# Patient Record
Sex: Female | Born: 2000 | Race: Black or African American | Hispanic: No | Marital: Single | Smoking: Never smoker
Health system: Southern US, Community
[De-identification: ages and names within clinical notes are randomized; demographics above are authoritative.]

## PROBLEM LIST (undated history)

## (undated) ENCOUNTER — Inpatient Hospital Stay (HOSPITAL_COMMUNITY): Payer: Self-pay

## (undated) DIAGNOSIS — Z789 Other specified health status: Secondary | ICD-10-CM

## (undated) DIAGNOSIS — A749 Chlamydial infection, unspecified: Secondary | ICD-10-CM

## (undated) HISTORY — DX: Chlamydial infection, unspecified: A74.9

## (undated) HISTORY — PX: NO PAST SURGERIES: SHX2092

---
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
## 2021-05-30 ENCOUNTER — Ambulatory Visit: Payer: Medicaid Other | Admitting: Obstetrics and Gynecology

## 2021-06-09 ENCOUNTER — Encounter (HOSPITAL_COMMUNITY): Payer: Self-pay | Admitting: Emergency Medicine

## 2021-06-09 ENCOUNTER — Other Ambulatory Visit: Payer: Self-pay

## 2021-06-09 ENCOUNTER — Emergency Department (HOSPITAL_COMMUNITY)
Admission: EM | Admit: 2021-06-09 | Discharge: 2021-06-09 | Disposition: A | Discharge status: Home / Self Care | Payer: Medicaid Other | Attending: Emergency Medicine | Admitting: Emergency Medicine

## 2021-06-09 DIAGNOSIS — Z5321 Procedure and treatment not carried out due to patient leaving prior to being seen by health care provider: Secondary | ICD-10-CM | POA: Diagnosis not present

## 2021-06-09 DIAGNOSIS — R300 Dysuria: Secondary | ICD-10-CM | POA: Diagnosis not present

## 2021-06-09 DIAGNOSIS — R102 Pelvic and perineal pain: Secondary | ICD-10-CM | POA: Diagnosis present

## 2021-06-09 LAB — CBC WITH DIFFERENTIAL/PLATELET
Abs Immature Granulocytes: 0.02 10*3/uL (ref 0.00–0.07)
Basophils Absolute: 0 10*3/uL (ref 0.0–0.1)
Basophils Relative: 0 %
Eosinophils Absolute: 0.1 10*3/uL (ref 0.0–0.5)
Eosinophils Relative: 1 %
HCT: 37 % (ref 36.0–46.0)
Hemoglobin: 11.9 g/dL — ABNORMAL LOW (ref 12.0–15.0)
Immature Granulocytes: 0 %
Lymphocytes Relative: 37 %
Lymphs Abs: 2.7 10*3/uL (ref 0.7–4.0)
MCH: 29.9 pg (ref 26.0–34.0)
MCHC: 32.2 g/dL (ref 30.0–36.0)
MCV: 93 fL (ref 80.0–100.0)
Monocytes Absolute: 0.5 10*3/uL (ref 0.1–1.0)
Monocytes Relative: 7 %
Neutro Abs: 3.9 10*3/uL (ref 1.7–7.7)
Neutrophils Relative %: 55 %
Platelets: 295 10*3/uL (ref 150–400)
RBC: 3.98 MIL/uL (ref 3.87–5.11)
RDW: 13.8 % (ref 11.5–15.5)
WBC: 7.2 10*3/uL (ref 4.0–10.5)
nRBC: 0 % (ref 0.0–0.2)

## 2021-06-09 LAB — COMPREHENSIVE METABOLIC PANEL
ALT: 11 U/L (ref 0–44)
AST: 18 U/L (ref 15–41)
Albumin: 4.3 g/dL (ref 3.5–5.0)
Alkaline Phosphatase: 61 U/L (ref 38–126)
Anion gap: 8 (ref 5–15)
BUN: 17 mg/dL (ref 6–20)
CO2: 23 mmol/L (ref 22–32)
Calcium: 9 mg/dL (ref 8.9–10.3)
Chloride: 107 mmol/L (ref 98–111)
Creatinine, Ser: 0.82 mg/dL (ref 0.44–1.00)
GFR, Estimated: >60 mL/min (ref >60)
Glucose, Bld: 86 mg/dL (ref 70–99)
Potassium: 3.9 mmol/L (ref 3.5–5.1)
Sodium: 138 mmol/L (ref 135–145)
Total Bilirubin: 0.4 mg/dL (ref 0.3–1.2)
Total Protein: 7.7 g/dL (ref 6.5–8.1)

## 2021-06-09 LAB — URINALYSIS, ROUTINE W REFLEX MICROSCOPIC
Bilirubin Urine: NEGATIVE
Glucose, UA: NEGATIVE mg/dL
Hgb urine dipstick: NEGATIVE
Ketones, ur: NEGATIVE mg/dL
Nitrite: NEGATIVE
Protein, ur: NEGATIVE mg/dL
Specific Gravity, Urine: 1.027 (ref 1.005–1.030)
pH: 5 (ref 5.0–8.0)

## 2021-06-09 LAB — I-STAT BETA HCG BLOOD, ED (MC, WL, AP ONLY): I-stat hCG, quantitative: <5 m[IU]/mL (ref <5)

## 2021-06-09 LAB — PREGNANCY, URINE: Preg Test, Ur: NEGATIVE

## 2021-06-09 NOTE — ED Triage Notes (Signed)
Patient states she has been having sharp pelvic pain after her period ended 2 weeks ago. Patient states no vaginal bleeding or discharge, patient unsure if she is pregnant.

## 2021-06-09 NOTE — ED Provider Notes (Signed)
Emergency Medicine Provider Triage Evaluation Note  Ana Horton , a 20 y.o. female  was evaluated in triage.  Pt complains of pelvic pain x 2 weeks.  Reports some dysuria.  Denies new or unusual vaginal discharge or bleeding.  Denies any treatment PTA.  Review of Systems  Positive: Suprapubic pain, abdominal pain Negative: Fever, chills  Physical Exam  BP 117/86 (BP Location: Right Arm)   Pulse 87   Temp 98.5 F (36.9 C)   Resp 18   LMP 05/25/2021 (Approximate)   SpO2 100%  Gen:   Awake, no distress   Resp:  Normal effort  MSK:   Moves extremities without difficulty  Other:    Medical Decision Making  Medically screening exam initiated at 1:25 AM.  Appropriate orders placed.  Ana Horton was informed that the remainder of the evaluation will be completed by another provider, this initial triage assessment does not replace that evaluation, and the importance of remaining in the ED until their evaluation is complete.  Pelvic pain   Roxy Horseman, PA-C 06/09/21 0126    Jacalyn Lefevre, MD 06/09/21 [phone]

## 2021-06-27 ENCOUNTER — Ambulatory Visit: Payer: Medicaid Other | Admitting: Obstetrics & Gynecology

## 2021-07-03 ENCOUNTER — Encounter: Payer: Medicaid Other | Admitting: Obstetrics and Gynecology

## 2021-08-04 ENCOUNTER — Encounter (HOSPITAL_COMMUNITY): Payer: Self-pay

## 2021-08-04 ENCOUNTER — Emergency Department (HOSPITAL_COMMUNITY)
Admission: EM | Admit: 2021-08-04 | Discharge: 2021-08-04 | Disposition: A | Discharge status: Home / Self Care | Payer: Medicaid Other | Attending: Emergency Medicine | Admitting: Emergency Medicine

## 2021-08-04 ENCOUNTER — Other Ambulatory Visit: Payer: Self-pay

## 2021-08-04 DIAGNOSIS — O219 Vomiting of pregnancy, unspecified: Secondary | ICD-10-CM | POA: Insufficient documentation

## 2021-08-04 DIAGNOSIS — Z20822 Contact with and (suspected) exposure to covid-19: Secondary | ICD-10-CM | POA: Insufficient documentation

## 2021-08-04 DIAGNOSIS — Z3A01 Less than 8 weeks gestation of pregnancy: Secondary | ICD-10-CM | POA: Diagnosis not present

## 2021-08-04 DIAGNOSIS — O26851 Spotting complicating pregnancy, first trimester: Secondary | ICD-10-CM | POA: Insufficient documentation

## 2021-08-04 DIAGNOSIS — O209 Hemorrhage in early pregnancy, unspecified: Secondary | ICD-10-CM | POA: Diagnosis not present

## 2021-08-04 DIAGNOSIS — O00102 Left tubal pregnancy without intrauterine pregnancy: Secondary | ICD-10-CM | POA: Diagnosis not present

## 2021-08-04 DIAGNOSIS — Z3490 Encounter for supervision of normal pregnancy, unspecified, unspecified trimester: Secondary | ICD-10-CM

## 2021-08-04 LAB — PREGNANCY, URINE: Preg Test, Ur: POSITIVE — AB

## 2021-08-04 LAB — RESP PANEL BY RT-PCR (FLU A&B, COVID) ARPGX2
Influenza A by PCR: NEGATIVE
Influenza B by PCR: NEGATIVE
SARS Coronavirus 2 by RT PCR: NEGATIVE

## 2021-08-04 NOTE — Discharge Instructions (Addendum)
It was a pleasure taking care of you today!  Your COVID and flu swab was negative.  Your urine pregnancy test was positive in the ED.  Attached you will find information for the [facility] at [facility] med Center for women, call on 08/05/2021 and set up a follow-up appointment regarding today's ED visit.  Ensure to maintain fluid intake.  Start taking over-the-counter prenatal vitamins daily.  You may follow-up with your primary care provider as needed.  Return to the ED if you are experiencing increasing/worsening abdominal cramping, vaginal fluid loss, worsening symptoms.

## 2021-08-04 NOTE — ED Provider Notes (Signed)
[facility] DEPT Provider Note   CSN: [id number] Arrival date & time: 08/04/21  1939     History  Chief Complaint  Patient presents with   Nausea   Abdominal Cramping    Ana Horton is a 21 y.o. female who presents to the ED complaining of nausea and abdominal cramping onset today.  Patient has associated nonbloody emesis (1 episode), increased fatigue.  Has not tried any medication for symptoms.  She notes that this is the second week of her menstrual cycle and she is currently spotting.  She is sexually active with 1 female partner with intermittent protected intercourse.  Denies concerns for STD at this time.  Denies fever, chills, nausea, constipation, diarrhea, dysuria, hematuria, vaginal discharge.  The history is provided by the patient. No language interpreter was used.      Home Medications Prior to Admission medications   Medication Sig Start Date End Date Taking? Authorizing Provider  BIOTIN PO Take by mouth.    [provider]  fluconazole (DIFLUCAN) 150 MG tablet Take 1 tablet (150 mg total) by mouth daily. 11/25/19   Clarnce Flock, MD  sertraline (ZOLOFT) 50 MG tablet Take 1 tablet (50 mg total) by mouth daily. 11/23/19   Clarnce Flock, MD  Turmeric (QC TUMERIC COMPLEX PO) Take by mouth.    [provider]      Allergies    Patient has no known allergies.    Review of Systems   Review of Systems  Constitutional:  Positive for fatigue. Negative for chills and fever.  Gastrointestinal:  Positive for vomiting. Negative for abdominal pain, constipation, diarrhea and nausea.  Genitourinary:  Negative for dysuria, hematuria and vaginal discharge.  Skin:  Negative for rash.  All other systems reviewed and are negative.  Physical Exam Updated Vital Signs BP 131/80 (BP Location: Left Arm)    Pulse 89    Temp 99.3 F (37.4 C) (Oral)    Resp 18    Ht 5\' 4"  (1.626 m)    Wt 72.6 kg    SpO2 95%    BMI 27.46 kg/m   Physical Exam Vitals and nursing note reviewed.  Constitutional:      General: She is not in acute distress.    Appearance: She is not diaphoretic.  HENT:     Head: Normocephalic and atraumatic.     Mouth/Throat:     Pharynx: No oropharyngeal exudate.  Eyes:     General: No scleral icterus.    Conjunctiva/sclera: Conjunctivae normal.  Cardiovascular:     Rate and Rhythm: Normal rate and regular rhythm.     Pulses: Normal pulses.     Heart sounds: Normal heart sounds.  Pulmonary:     Effort: Pulmonary effort is normal. No respiratory distress.     Breath sounds: Normal breath sounds. No wheezing.  Abdominal:     General: Bowel sounds are normal.     Palpations: Abdomen is soft. There is no mass.     Tenderness: There is no abdominal tenderness. There is no guarding or rebound.     Comments: No tenderness to palpation noted to abdomen.  Musculoskeletal:        General: Normal range of motion.     Cervical back: Normal range of motion and neck supple.  Skin:    General: Skin is warm and dry.  Neurological:     Mental Status: She is alert.  Psychiatric:        Behavior: Behavior  normal.    ED Results / Procedures / Treatments   Labs (all labs ordered are listed, but only abnormal results are displayed) Labs Reviewed  PREGNANCY, URINE - Abnormal; Notable for the following components:      Result Value   Preg Test, Ur POSITIVE (*)    All other components within normal limits  RESP PANEL BY RT-PCR (FLU A&B, COVID) [id number]    EKG None  Radiology No results found.  Procedures Procedures    Medications Ordered in ED Medications - No data to display  ED Course/ Medical Decision Making/ A&P Clinical Course as of 08/04/21 2230  Nancy Fetter Aug 04, 2021  2145 Case discussed with attending who agrees with giving patient information for women's clinic for follow-up and for patient to take prenatal vitamins. [SB]  2155 Detailed discussion with patient and her fianc at bedside  regarding positive pregnancy test and discharge treatment plan.  Answered all questions as they arose.  Patient agreeable to discharge treatment plan.  Patient appears safe for discharge at this time. [SB]    Clinical Course User Index [SB] Vlada Uriostegui A, PA-C                           Medical Decision Making Amount and/or Complexity of Data Reviewed Labs: ordered.   Patient presents to the ED with nausea and abdominal cramping onset today.  She has had 1 episode of nonbloody emesis.  Also she is on week 2 of her menstrual cycle and notes that she is now having spotting.  Denies concerns for STI at this time.  Vital signs stable, patient afebrile, not tachycardic or hypoxic.  On exam patient without acute cardiovascular, respiratory, abdominal exam findings.  Differential diagnosis includes COVID, flu, viral gastroenteritis, pregnancy.  Labs:  I ordered, and personally interpreted labs.  The pertinent results include:  COVID and flu swab negative. Urine pregnancy test positive  Disposition: Patient presentation suspicious for pregnancy as cause of her symptoms.  Doubt COVID, flu, viral gastroenteritis at this time.  After consideration of the diagnostic results and the patients response to treatment, I feel that the patient would benefit from discharge home with [facility]  Detailed discussion held at bedside with patient and her fianc regarding importance of following up with [facility].  Also discussed importance of starting prenatal vitamins at bedside.  Patient notes that she has already been taking prenatal vitamins.  Supportive care measures and strict return precautions discussed with patient at bedside. Pt acknowledges and verbalizes understanding. Pt appears safe for discharge. Follow up as indicated in discharge paperwork.    This chart was dictated using voice recognition software, Dragon.  Despite the best efforts of this provider to proofread and correct errors, errors may still occur which can change documentation meaning.  Final Clinical Impression(s) / ED Diagnoses Final diagnoses:  Pregnancy, unspecified gestational age    Rx / DC Orders ED Discharge Orders     None         Ranelle Auker A, PA-C 08/04/21 2230    Lacretia Leigh, MD 08/08/21 2248

## 2021-08-04 NOTE — ED Triage Notes (Signed)
Pt reports with nausea and abdominal cramping since today. Pt had one episode of emesis. Pt states that she has been feeling more tired lately. Pt is currently "spotting" going on her second week.

## 2021-08-05 ENCOUNTER — Inpatient Hospital Stay (HOSPITAL_COMMUNITY): Payer: Medicaid Other

## 2021-08-05 ENCOUNTER — Encounter (HOSPITAL_COMMUNITY): Payer: Self-pay | Admitting: Obstetrics & Gynecology

## 2021-08-05 ENCOUNTER — Telehealth: Payer: Self-pay | Admitting: Family Medicine

## 2021-08-05 ENCOUNTER — Inpatient Hospital Stay (HOSPITAL_COMMUNITY)
Admission: AD | Admit: 2021-08-05 | Discharge: 2021-08-06 | Disposition: A | Discharge status: Home / Self Care | Payer: Medicaid Other | Attending: Obstetrics & Gynecology | Admitting: Obstetrics & Gynecology

## 2021-08-05 DIAGNOSIS — O029 Abnormal product of conception, unspecified: Secondary | ICD-10-CM | POA: Insufficient documentation

## 2021-08-05 DIAGNOSIS — O209 Hemorrhage in early pregnancy, unspecified: Secondary | ICD-10-CM

## 2021-08-05 DIAGNOSIS — O00102 Left tubal pregnancy without intrauterine pregnancy: Secondary | ICD-10-CM | POA: Diagnosis present

## 2021-08-05 DIAGNOSIS — O081 Delayed or excessive hemorrhage following ectopic and molar pregnancy: Secondary | ICD-10-CM | POA: Insufficient documentation

## 2021-08-05 LAB — CBC
HCT: 37.2 % (ref 36.0–46.0)
Hemoglobin: 12 g/dL (ref 12.0–15.0)
MCH: 29.7 pg (ref 26.0–34.0)
MCHC: 32.3 g/dL (ref 30.0–36.0)
MCV: 92.1 fL (ref 80.0–100.0)
Platelets: 283 10*3/uL (ref 150–400)
RBC: 4.04 MIL/uL (ref 3.87–5.11)
RDW: 13.6 % (ref 11.5–15.5)
WBC: 9.5 10*3/uL (ref 4.0–10.5)
nRBC: 0 % (ref 0.0–0.2)

## 2021-08-05 LAB — HCG, QUANTITATIVE, PREGNANCY: hCG, Beta Chain, Quant, S: 5962 m[IU]/mL — ABNORMAL HIGH (ref <5)

## 2021-08-05 LAB — COMPREHENSIVE METABOLIC PANEL
ALT: 11 U/L (ref 0–44)
AST: 16 U/L (ref 15–41)
Albumin: 4.1 g/dL (ref 3.5–5.0)
Alkaline Phosphatase: 65 U/L (ref 38–126)
Anion gap: 9 (ref 5–15)
BUN: 8 mg/dL (ref 6–20)
CO2: 24 mmol/L (ref 22–32)
Calcium: 9.4 mg/dL (ref 8.9–10.3)
Chloride: 103 mmol/L (ref 98–111)
Creatinine, Ser: 0.78 mg/dL (ref 0.44–1.00)
GFR, Estimated: >60 mL/min (ref >60)
Glucose, Bld: 84 mg/dL (ref 70–99)
Potassium: 3.6 mmol/L (ref 3.5–5.1)
Sodium: 136 mmol/L (ref 135–145)
Total Bilirubin: 0.3 mg/dL (ref 0.3–1.2)
Total Protein: 7.1 g/dL (ref 6.5–8.1)

## 2021-08-05 LAB — ABO/RH: ABO/RH(D): A POS

## 2021-08-05 MED ORDER — METHOTREXATE FOR ECTOPIC PREGNANCY
50.0000 mg/m2 | Freq: Once | INTRAMUSCULAR | Status: AC
  Administered 2021-08-05: 92.5 mg via INTRAMUSCULAR
  Filled 2021-08-05: qty 10

## 2021-08-05 NOTE — MAU Note (Signed)
Pt presents to MAU with c/o bleeding and mild cramping for the past two weeks. Pt recently found out she was pregnant on yesterday. Bleeding is light today, she reports that uses about 2 pads per day.  Pain = 1/10.

## 2021-08-05 NOTE — MAU Note (Signed)
PT SAYS WENT TO [facility]  LAST NIGHT -TO CONFIRM PREG- POSITIVE  STARTED  SPOTTING LAST WEEK ? WHEN SHE WIPED - RED, BROWN , PINK  TODAY RED WHEN SHE WIPED FEELS LESS THAN MILD CRAMPS - 2 VOMITED YESTERDAY AT WORK .

## 2021-08-05 NOTE — Progress Notes (Signed)
I have viewed all of the sonogram images and agree that this is a left ectopic pregnancy 1.37 cm with a yolk sac, HCG pending at time of this note.  A positive  However I disagree with the conclusion that this is a ruptured ectopic, clinically that is not the case and sonographically while there is complex fluid in the tube there is little blood flow around the ectopic itself and the fluid is in the tube measuring 1.67 in diameter and is contained not free in the pelvis.  This was also the real time conclusion of the examining technologist.  I evaluated all of the images and there is no evidence of free fluid and rupture and again clinically this is not the case  I have consulted with patient at length and she agrees with this approach including the methotrexate therapy and surveillance  She understands there is about 15% chance of rupture, but with the size much less likely than the study average.  Lazaro Arms, MD 08/05/2021 10:30 PM

## 2021-08-05 NOTE — MAU Provider Note (Signed)
Event Date/Time   First Provider Initiated Contact with Patient 08/05/21 2150      Chief Complaint:  Vaginal Bleeding   Ana Horton is  21 y.o. G1P0000.  Patient's last menstrual period was 07/25/2021.Marland Kitchen.  Her pregnancy status is positive.  She presents complaining of Vaginal Bleeding . She has been having small amounts of spotting for a few weeks,  "Not really" any abdominal pain.    No past medical history on file.  No past surgical history on file.  Family History  Problem Relation Age of Onset   Hypertension Father    Thyroid disease Mother     Social History   Tobacco Use   Smoking status: Some Days   Smokeless tobacco: Never   Tobacco comments:    black and mild  Vaping Use   Vaping Use: Every day  Substance Use Topics   Alcohol use: No   Drug use: No    Allergies: No Known Allergies  Medications Prior to Admission  Medication Sig Dispense Refill Last Dose   BIOTIN PO Take by mouth.      fluconazole (DIFLUCAN) 150 MG tablet Take 1 tablet (150 mg total) by mouth daily. 1 tablet 0    sertraline (ZOLOFT) 50 MG tablet Take 1 tablet (50 mg total) by mouth daily. 30 tablet 5    Turmeric (QC TUMERIC COMPLEX PO) Take by mouth.        Review of Systems   Constitutional: Negative for fever and chills Eyes: Negative for visual disturbances Respiratory: Negative for shortness of breath, dyspnea Cardiovascular: Negative for chest pain or palpitations  Gastrointestinal: Negative for vomiting, diarrhea and constipation Genitourinary: Negative for dysuria and urgency Musculoskeletal: Negative for back pain, joint pain, myalgias  Neurological: Negative for dizziness and headaches     Physical Exam   Blood pressure 116/69, pulse 66, temperature 99.2 F (37.3 C), temperature source Oral, resp. rate 20, height 5\' 3"  (1.6 m), weight 77.5 kg, last menstrual period 07/25/2021, SpO2 100 %.  General: General appearance - alert, well appearing, and in no distress and  oriented to person, place, and time Chest - normal respiratory effort Heart - normal rate and regular rhythm Abdomen - soft, non tender Pelvic - deferred Extremities - no pedal edema noted   Labs: Results for orders placed or performed during the hospital encounter of 08/05/21 (from the past 24 hour(s))  CBC   Collection Time: 08/05/21  8:52 PM  Result Value Ref Range   WBC 9.5 4.0 - 10.5 K/uL   RBC 4.04 3.87 - 5.11 MIL/uL   Hemoglobin 12.0 12.0 - 15.0 g/dL   HCT 40.937.2 81.136.0 - 91.446.0 %   MCV 92.1 80.0 - 100.0 fL   MCH 29.7 26.0 - 34.0 pg   MCHC 32.3 30.0 - 36.0 g/dL   RDW 78.213.6 95.611.5 - 21.315.5 %   Platelets 283 150 - 400 K/uL   nRBC 0.0 0.0 - 0.2 %  hCG, quantitative, pregnancy   Collection Time: 08/05/21  8:52 PM  Result Value Ref Range   hCG, Beta Chain, Quant, S 5,962 (H) <5 mIU/mL  Comprehensive metabolic panel   Collection Time: 08/05/21  8:52 PM  Result Value Ref Range   Sodium 136 135 - 145 mmol/L   Potassium 3.6 3.5 - 5.1 mmol/L   Chloride 103 98 - 111 mmol/L   CO2 24 22 - 32 mmol/L   Glucose, Bld 84 70 - 99 mg/dL   BUN 8 6 - 20 mg/dL  Creatinine, Ser 0.78 0.44 - 1.00 mg/dL   Calcium 9.4 8.9 - 33.8 mg/dL   Total Protein 7.1 6.5 - 8.1 g/dL   Albumin 4.1 3.5 - 5.0 g/dL   AST 16 15 - 41 U/L   ALT 11 0 - 44 U/L   Alkaline Phosphatase 65 38 - 126 U/L   Total Bilirubin 0.3 0.3 - 1.2 mg/dL   GFR, Estimated >25 >05 mL/min   Anion gap 9 5 - 15  ABO/Rh   Collection Time: 08/05/21  8:52 PM  Result Value Ref Range   ABO/RH(D) A POS    No rh immune globuloin      NOT A RH IMMUNE GLOBULIN CANDIDATE, PT RH POSITIVE Performed at [facility], [address] [place], [place] [address]    Imaging Studies:  US OB LESS THAN 14 WEEKS WITH OB TRANSVAGINAL  Result Date: 08/05/2021 CLINICAL DATA:  Vaginal bleeding, pelvic cramping, LMP 07/25/2021 EXAM: OBSTETRIC <14 WK [place] AND TRANSVAGINAL OB US TECHNIQUE: Both transabdominal and transvaginal ultrasound examinations were  performed for complete evaluation of the gestation as well as the maternal uterus, adnexal regions, and pelvic cul-de-sac. Transvaginal technique was performed to assess early pregnancy. COMPARISON:  None. FINDINGS: Intrauterine gestational sac: None identified Maternal uterus/adnexae: The uterus is anteverted. No intrarenal masses are seen. The cervix is closed and is unremarkable. The endometrial stripe is uniform measuring 8 mm in thickness. The right ovary is unremarkable. Immediately adjacent to the left ovary is a cystic structure demonstrating echogenic margins and a a internal yolk sac compatible with an ectopic gestational sac either within or immediately adjacent to the left ovary. This is best appreciated on cine images. There is a moderate amount of complex appearing fluid within the cul-de-sac demonstrating echogenic internal debris best appreciated on image # 19 likely representing blood product in the setting of a ruptured ectopic pregnancy. IMPRESSION: Extrauterine gestational sac immediately adjacent or within the left ovary with moderate complex appearing fluid within the pelvis likely representing blood product in keeping with a ruptured ectopic pregnancy. Electronically Signed   By: Helyn Numbers M.D.   On: 08/05/2021 21:39    Per Dr. Despina Hidden:   I have viewed all of the sonogram images and agree that this is a left ectopic pregnancy 1.37 cm with a yolk sac, HCG pending at time of this note.  A positive   However I disagree with the conclusion that this is a ruptured ectopic, clinically that is not the case and sonographically while there is complex fluid in the tube there is little blood flow around the ectopic itself and the fluid is in the tube measuring 1.67 in diameter and is contained not free in the pelvis.  This was also the real time conclusion of the examining technologist.  I evaluated all of the images and there is no evidence of free fluid and rupture and again clinically this is  not the case   I have consulted with patient at length and she agrees with this approach including the methotrexate therapy and surveillance  She understands there is about 15% chance of rupture, but with the size much less likely than the study average.   Lazaro Arms, MD 08/05/2021 10:30 PM       Assessment: Left ectopic pregnancy  Plan: Methotrexate Treatment Protocol for Ectopic Pregnancy  Pretreatment testing and instructions  hCG concentration  Transvaginal ultrasound  Blood group and Rh(D) typing; Complete blood count  Liver and renal function tests  Discontinue folic acid supplements  Counsel patient to avoid NSAIDs, recommend acetaminophen if an analgesic is needed  Advise patient to refrain from sexual intercourse and strenuous exercise  Treatment day  Single dose protocol   1  hCG.  Administer Methotrexate 50 mg/m2 body surface area IM  4 (Saturday per [facility]) hCG  7  hCG  If <15 percent hCG decline from day 4 to 7, give additional dose of methotrexate 50 mg/m2 IM  If ?15 percent hCG decline from day 4 to 7, draw hCG weekly until undetectable  14  hCG  If <15 percent hCG decline from day 7 to 14, give additional dose of methotrexate 50 mg/m2 IM  If ?15 percent hCG decline from day 7 to 14, check hCG weekly until undetectable  21 and 28  If 3 doses have been given and there is a <15 percent hCG decline from day 21 to 28, proceed with laparoscopic surgery    The risks of methotrexate were reviewed including failure requiring repeat dosing or eventual surgery. She understands that methotrexate involves frequent return visits to monitor lab values and that she remains at risk of ectopic rupture until her beta is less than assay. The patient opts to proceed with methotrexate. She has no history of hepatic or renal dysfunction, has  normal BUN/Cr/LFT's/platelets. She is felt to be reliable for follow-up. Side effects of photosensitivity & GI upset were discussed. She knows to  avoid direct sunlight and abstain from alcohol, aspirin and aspirin-like products for two weeks. She was counseled to discontinue any MVI with folic acid. She understands to follow up on D4 (2/11) and D7 (2/14) for repeat BHCG and was given the instruction sheet. Strict ectopic precautions were reviewed, the patient knows to call with any abdominal pain, vomiting, fainting, or any concerns with her health. Rh +.  Christin Fudge

## 2021-08-05 NOTE — Telephone Encounter (Signed)
Patient called in to be scheduled for a new OB appointment..patient stated she has been spotting for almost 3 weeks now, spoke to two of our clinical staff and they advised patient to go to the [facility].

## 2021-08-10 ENCOUNTER — Other Ambulatory Visit: Payer: Self-pay

## 2021-08-10 ENCOUNTER — Inpatient Hospital Stay (HOSPITAL_COMMUNITY): Payer: Medicaid Other

## 2021-08-10 ENCOUNTER — Inpatient Hospital Stay (HOSPITAL_BASED_OUTPATIENT_CLINIC_OR_DEPARTMENT_OTHER): Payer: Medicaid Other | Admitting: Registered Nurse

## 2021-08-10 ENCOUNTER — Ambulatory Visit (HOSPITAL_COMMUNITY)
Admission: AD | Admit: 2021-08-10 | Discharge: 2021-08-10 | Disposition: A | Discharge status: Home / Self Care | Payer: Medicaid Other | Attending: Obstetrics and Gynecology | Admitting: Obstetrics and Gynecology

## 2021-08-10 ENCOUNTER — Inpatient Hospital Stay (HOSPITAL_COMMUNITY): Payer: Medicaid Other | Admitting: Registered Nurse

## 2021-08-10 ENCOUNTER — Encounter (HOSPITAL_COMMUNITY): Payer: Self-pay | Admitting: Obstetrics and Gynecology

## 2021-08-10 ENCOUNTER — Encounter (HOSPITAL_COMMUNITY)
Admission: AD | Disposition: A | Discharge status: Home / Self Care | Payer: Self-pay | Source: Home / Self Care | Attending: Obstetrics and Gynecology

## 2021-08-10 ENCOUNTER — Telehealth (HOSPITAL_COMMUNITY): Payer: Self-pay | Admitting: *Deleted

## 2021-08-10 ENCOUNTER — Other Ambulatory Visit (HOSPITAL_COMMUNITY): Payer: Medicaid Other

## 2021-08-10 DIAGNOSIS — F418 Other specified anxiety disorders: Secondary | ICD-10-CM | POA: Diagnosis not present

## 2021-08-10 DIAGNOSIS — O00102 Left tubal pregnancy without intrauterine pregnancy: Secondary | ICD-10-CM | POA: Insufficient documentation

## 2021-08-10 DIAGNOSIS — O00109 Unspecified tubal pregnancy without intrauterine pregnancy: Secondary | ICD-10-CM

## 2021-08-10 DIAGNOSIS — F17210 Nicotine dependence, cigarettes, uncomplicated: Secondary | ICD-10-CM | POA: Insufficient documentation

## 2021-08-10 DIAGNOSIS — O00202 Left ovarian pregnancy without intrauterine pregnancy: Secondary | ICD-10-CM | POA: Diagnosis not present

## 2021-08-10 HISTORY — PX: LAPAROSCOPIC UNILATERAL SALPINGECTOMY: SHX5934

## 2021-08-10 HISTORY — DX: Unspecified tubal pregnancy without intrauterine pregnancy: O00.109

## 2021-08-10 HISTORY — DX: Other specified health status: Z78.9

## 2021-08-10 LAB — CBC WITH DIFFERENTIAL/PLATELET
Abs Immature Granulocytes: 0.03 10*3/uL (ref 0.00–0.07)
Basophils Absolute: 0 10*3/uL (ref 0.0–0.1)
Basophils Relative: 0 %
Eosinophils Absolute: 0 10*3/uL (ref 0.0–0.5)
Eosinophils Relative: 0 %
HCT: 36.8 % (ref 36.0–46.0)
Hemoglobin: 12.4 g/dL (ref 12.0–15.0)
Immature Granulocytes: 0 %
Lymphocytes Relative: 21 %
Lymphs Abs: 1.9 10*3/uL (ref 0.7–4.0)
MCH: 30.7 pg (ref 26.0–34.0)
MCHC: 33.7 g/dL (ref 30.0–36.0)
MCV: 91.1 fL (ref 80.0–100.0)
Monocytes Absolute: 0.4 10*3/uL (ref 0.1–1.0)
Monocytes Relative: 4 %
Neutro Abs: 7 10*3/uL (ref 1.7–7.7)
Neutrophils Relative %: 75 %
Platelets: 263 10*3/uL (ref 150–400)
RBC: 4.04 MIL/uL (ref 3.87–5.11)
RDW: 13.5 % (ref 11.5–15.5)
WBC: 9.4 10*3/uL (ref 4.0–10.5)
nRBC: 0 % (ref 0.0–0.2)

## 2021-08-10 LAB — COMPREHENSIVE METABOLIC PANEL
ALT: 14 U/L (ref 0–44)
AST: 19 U/L (ref 15–41)
Albumin: 4.3 g/dL (ref 3.5–5.0)
Alkaline Phosphatase: 68 U/L (ref 38–126)
Anion gap: 7 (ref 5–15)
BUN: 12 mg/dL (ref 6–20)
CO2: 24 mmol/L (ref 22–32)
Calcium: 9.6 mg/dL (ref 8.9–10.3)
Chloride: 104 mmol/L (ref 98–111)
Creatinine, Ser: 0.81 mg/dL (ref 0.44–1.00)
GFR, Estimated: >60 mL/min (ref >60)
Glucose, Bld: 84 mg/dL (ref 70–99)
Potassium: 3.7 mmol/L (ref 3.5–5.1)
Sodium: 135 mmol/L (ref 135–145)
Total Bilirubin: 0.6 mg/dL (ref 0.3–1.2)
Total Protein: 7.8 g/dL (ref 6.5–8.1)

## 2021-08-10 LAB — TYPE AND SCREEN
ABO/RH(D): A POS
Antibody Screen: NEGATIVE

## 2021-08-10 LAB — HCG, QUANTITATIVE, PREGNANCY: hCG, Beta Chain, Quant, S: 8249 m[IU]/mL — ABNORMAL HIGH (ref <5)

## 2021-08-10 SURGERY — SALPINGECTOMY, UNILATERAL, LAPAROSCOPIC
Anesthesia: General | Laterality: Left

## 2021-08-10 MED ORDER — POVIDONE-IODINE 10 % EX SWAB
2.0000 "application " | Freq: Once | CUTANEOUS | Status: AC
  Administered 2021-08-10: 2 via TOPICAL

## 2021-08-10 MED ORDER — FENTANYL CITRATE (PF) 100 MCG/2ML IJ SOLN: 25.0000 ug | INTRAMUSCULAR | Status: DC | PRN

## 2021-08-10 MED ORDER — OXYCODONE HCL 5 MG/5ML PO SOLN: 5.0000 mg | Freq: Once | ORAL | Status: DC | PRN

## 2021-08-10 MED ORDER — PROPOFOL 10 MG/ML IV BOLUS
INTRAVENOUS | Status: DC | PRN
  Administered 2021-08-10: 180 mg via INTRAVENOUS

## 2021-08-10 MED ORDER — SODIUM CHLORIDE 0.9 % IR SOLN
Status: DC | PRN
  Administered 2021-08-10: 3000 mL

## 2021-08-10 MED ORDER — CHLORHEXIDINE GLUCONATE 0.12 % MT SOLN: 15.0000 mL | Freq: Once | OROMUCOSAL | Status: AC

## 2021-08-10 MED ORDER — CEFAZOLIN SODIUM-DEXTROSE 2-4 GM/100ML-% IV SOLN
2.0000 g | INTRAVENOUS | Status: AC
  Administered 2021-08-10: 2 g via INTRAVENOUS

## 2021-08-10 MED ORDER — ONDANSETRON HCL 4 MG/2ML IJ SOLN: 4.0000 mg | Freq: Four times a day (QID) | INTRAMUSCULAR | Status: DC | PRN

## 2021-08-10 MED ORDER — ROCURONIUM BROMIDE 10 MG/ML (PF) SYRINGE
PREFILLED_SYRINGE | INTRAVENOUS | Status: AC
  Filled 2021-08-10: qty 10

## 2021-08-10 MED ORDER — SUGAMMADEX SODIUM 200 MG/2ML IV SOLN
INTRAVENOUS | Status: DC | PRN
  Administered 2021-08-10: 200 mg via INTRAVENOUS

## 2021-08-10 MED ORDER — LACTATED RINGERS IV SOLN: INTRAVENOUS | Status: DC

## 2021-08-10 MED ORDER — BUPIVACAINE HCL (PF) 0.25 % IJ SOLN
INTRAMUSCULAR | Status: AC
  Filled 2021-08-10: qty 30

## 2021-08-10 MED ORDER — SOD CITRATE-CITRIC ACID 500-334 MG/5ML PO SOLN
ORAL | Status: AC
  Administered 2021-08-10: 30 mL via ORAL
  Filled 2021-08-10: qty 30

## 2021-08-10 MED ORDER — LIDOCAINE 2% (20 MG/ML) 5 ML SYRINGE
INTRAMUSCULAR | Status: AC
  Filled 2021-08-10: qty 5

## 2021-08-10 MED ORDER — DEXAMETHASONE SODIUM PHOSPHATE 10 MG/ML IJ SOLN
INTRAMUSCULAR | Status: AC
  Filled 2021-08-10: qty 1

## 2021-08-10 MED ORDER — SODIUM CHLORIDE 0.9 % IV SOLN: INTRAVENOUS | Status: DC

## 2021-08-10 MED ORDER — MIDAZOLAM HCL 2 MG/2ML IJ SOLN
INTRAMUSCULAR | Status: DC | PRN
  Administered 2021-08-10: 2 mg via INTRAVENOUS

## 2021-08-10 MED ORDER — FENTANYL CITRATE (PF) 250 MCG/5ML IJ SOLN
INTRAMUSCULAR | Status: DC | PRN
  Administered 2021-08-10: 100 ug via INTRAVENOUS

## 2021-08-10 MED ORDER — BUPIVACAINE HCL 0.25 % IJ SOLN
INTRAMUSCULAR | Status: DC | PRN
  Administered 2021-08-10: 15 mL

## 2021-08-10 MED ORDER — IBUPROFEN 600 MG PO TABS: 600.0000 mg | ORAL_TABLET | Freq: Four times a day (QID) | ORAL | 1 refills | Status: DC | PRN

## 2021-08-10 MED ORDER — SUCCINYLCHOLINE CHLORIDE 200 MG/10ML IV SOSY
PREFILLED_SYRINGE | INTRAVENOUS | Status: DC | PRN
Start: 2021-08-10 — End: 2021-08-10
  Administered 2021-08-10: 120 mg via INTRAVENOUS

## 2021-08-10 MED ORDER — ACETAMINOPHEN 500 MG PO TABS
ORAL_TABLET | ORAL | Status: AC
  Administered 2021-08-10: 1000 mg via ORAL
  Filled 2021-08-10: qty 2

## 2021-08-10 MED ORDER — DEXAMETHASONE SODIUM PHOSPHATE 10 MG/ML IJ SOLN
INTRAMUSCULAR | Status: DC | PRN
Start: 2021-08-10 — End: 2021-08-10
  Administered 2021-08-10: 10 mg via INTRAVENOUS

## 2021-08-10 MED ORDER — KETOROLAC TROMETHAMINE 30 MG/ML IJ SOLN
INTRAMUSCULAR | Status: DC | PRN
Start: 2021-08-10 — End: 2021-08-10
  Administered 2021-08-10: 30 mg via INTRAVENOUS

## 2021-08-10 MED ORDER — ONDANSETRON HCL 4 MG/2ML IJ SOLN
INTRAMUSCULAR | Status: AC
  Filled 2021-08-10: qty 2

## 2021-08-10 MED ORDER — FENTANYL CITRATE (PF) 250 MCG/5ML IJ SOLN
INTRAMUSCULAR | Status: AC
  Filled 2021-08-10: qty 5

## 2021-08-10 MED ORDER — CHLORHEXIDINE GLUCONATE 0.12 % MT SOLN
OROMUCOSAL | Status: AC
  Administered 2021-08-10: 15 mL via OROMUCOSAL
  Filled 2021-08-10: qty 15

## 2021-08-10 MED ORDER — PROPOFOL 10 MG/ML IV BOLUS
INTRAVENOUS | Status: AC
  Filled 2021-08-10: qty 20

## 2021-08-10 MED ORDER — OXYCODONE HCL 5 MG PO TABS: 5.0000 mg | ORAL_TABLET | Freq: Once | ORAL | Status: DC | PRN

## 2021-08-10 MED ORDER — ACETAMINOPHEN 500 MG PO TABS: 1000.0000 mg | ORAL_TABLET | ORAL | Status: AC

## 2021-08-10 MED ORDER — SUCCINYLCHOLINE CHLORIDE 200 MG/10ML IV SOSY
PREFILLED_SYRINGE | INTRAVENOUS | Status: AC
  Filled 2021-08-10: qty 10

## 2021-08-10 MED ORDER — ROCURONIUM BROMIDE 10 MG/ML (PF) SYRINGE
PREFILLED_SYRINGE | INTRAVENOUS | Status: DC | PRN
  Administered 2021-08-10: 40 mg via INTRAVENOUS

## 2021-08-10 MED ORDER — OXYCODONE-ACETAMINOPHEN 5-325 MG PO TABS
1.0000 | ORAL_TABLET | ORAL | 0 refills | Status: AC | PRN
Start: 2021-08-10 — End: 2021-08-15

## 2021-08-10 MED ORDER — MIDAZOLAM HCL 2 MG/2ML IJ SOLN
INTRAMUSCULAR | Status: AC
  Filled 2021-08-10: qty 2

## 2021-08-10 MED ORDER — CEFAZOLIN SODIUM-DEXTROSE 2-4 GM/100ML-% IV SOLN
INTRAVENOUS | Status: AC
  Filled 2021-08-10: qty 100

## 2021-08-10 MED ORDER — SOD CITRATE-CITRIC ACID 500-334 MG/5ML PO SOLN: 30.0000 mL | ORAL | Status: AC

## 2021-08-10 MED ORDER — ONDANSETRON HCL 4 MG/2ML IJ SOLN
INTRAMUSCULAR | Status: DC | PRN
  Administered 2021-08-10: 4 mg via INTRAVENOUS

## 2021-08-10 SURGICAL SUPPLY — 33 items
APPLICATOR ARISTA FLEXITIP XL (MISCELLANEOUS) IMPLANT
DERMABOND ADVANCED (GAUZE/BANDAGES/DRESSINGS) ×1
DERMABOND ADVANCED .7 DNX12 (GAUZE/BANDAGES/DRESSINGS) ×1 IMPLANT
DRSG OPSITE POSTOP 3X4 (GAUZE/BANDAGES/DRESSINGS) ×2 IMPLANT
DURAPREP 26ML APPLICATOR (WOUND CARE) ×2 IMPLANT
GLOVE SRG 8 PF TXTR STRL LF DI (GLOVE) ×1 IMPLANT
GLOVE SURG ENC MOIS LTX SZ8 (GLOVE) ×2 IMPLANT
GLOVE SURG UNDER POLY LF SZ7 (GLOVE) ×4 IMPLANT
GLOVE SURG UNDER POLY LF SZ8 (GLOVE) ×1
GOWN STRL REUS W/ TWL LRG LVL3 (GOWN DISPOSABLE) ×1 IMPLANT
GOWN STRL REUS W/ TWL XL LVL3 (GOWN DISPOSABLE) ×1 IMPLANT
GOWN STRL REUS W/TWL LRG LVL3 (GOWN DISPOSABLE) ×1
GOWN STRL REUS W/TWL XL LVL3 (GOWN DISPOSABLE) ×1
HEMOSTAT ARISTA ABSORB 3G PWDR (HEMOSTASIS) IMPLANT
IRRIG SUCT STRYKERFLOW 2 WTIP (MISCELLANEOUS)
IRRIGATION SUCT STRKRFLW 2 WTP (MISCELLANEOUS) IMPLANT
KIT TURNOVER KIT B (KITS) ×2 IMPLANT
LIGASURE VESSEL 5MM BLUNT TIP (ELECTROSURGICAL) ×2 IMPLANT
NS IRRIG 1000ML POUR BTL (IV SOLUTION) ×2 IMPLANT
PACK LAPAROSCOPY BASIN (CUSTOM PROCEDURE TRAY) ×2 IMPLANT
PACK TRENDGUARD 450 HYBRID PRO (MISCELLANEOUS) ×1 IMPLANT
POUCH SPECIMEN RETRIEVAL 10MM (ENDOMECHANICALS) IMPLANT
PROTECTOR NERVE ULNAR (MISCELLANEOUS) ×4 IMPLANT
SET TUBE SMOKE EVAC HIGH FLOW (TUBING) ×2 IMPLANT
SLEEVE XCEL OPT CAN 5 100 (ENDOMECHANICALS) ×2 IMPLANT
SUT MNCRL AB 4-0 PS2 18 (SUTURE) ×2 IMPLANT
SUT VICRYL 0 UR6 27IN ABS (SUTURE) ×2 IMPLANT
TOWEL GREEN STERILE FF (TOWEL DISPOSABLE) ×4 IMPLANT
TRAY FOLEY W/BAG SLVR 14FR (SET/KITS/TRAYS/PACK) ×2 IMPLANT
TRENDGUARD 450 HYBRID PRO PACK (MISCELLANEOUS) ×2
TROCAR XCEL NON-BLD 11X100MML (ENDOMECHANICALS) ×2 IMPLANT
TROCAR XCEL NON-BLD 5MMX100MML (ENDOMECHANICALS) ×2 IMPLANT
WARMER LAPAROSCOPE (MISCELLANEOUS) ×2 IMPLANT

## 2021-08-10 NOTE — MAU Provider Note (Signed)
History     CSN: [id number]  Arrival date and time: 08/10/21 1440   Event Date/Time   First Provider Initiated Contact with Patient 08/10/21 1536      Chief Complaint  Patient presents with   Abdominal Pain   Ms. Ana Horton is a 21 y.o. G1P0000 at [redacted]w[redacted]d who presents to MAU for repeat hCG. Patient was given MTX on 08/05/2021 for a known left ectopic pregnancy with visualized yolk sac on [place] and a hCG of 5,962 and was advised to return today for repeat lab work. Day 4 s/p MTX would have been 08/08/2021 and Day 7 would have been tomorrow. Patient denies vaginal bleeding. Patient states that she was feeling well up until this morning after she had sex and then started to feel a lot of cramping in her abdomen and rectum. Patient reports she attempted to have a bowel movement, but was unsuccessful. Patient reports she last had a fish sandwich, steamed broccoli and juice at 11PM last night and only has had one sip of water since that time. Patient also reports one episode this morning of feeling hot, nauseous and like she was going to pass out prior to coming to MAU.  Pt denies VB, vaginal discharge/odor/itching. Pt denies N/V, constipation, diarrhea, or urinary problems. Pt denies fever, chills, fatigue, sweating or changes in appetite. Pt denies SOB or chest pain. Pt denies dizziness, HA, light-headedness, weakness.   OB History     Gravida  1   Para  0   Term  0   Preterm  0   AB  0   Living  0      SAB  0   IAB  0   Ectopic  0   Multiple  0   Live Births  0           Past Medical History:  Diagnosis Date   Medical history non-contributory     Past Surgical History:  Procedure Laterality Date   NO PAST SURGERIES      Family History  Problem Relation Age of Onset   Hypertension Father    Thyroid disease Mother     Social History   Tobacco Use   Smoking status: Some Days   Smokeless tobacco: Never   Tobacco comments:    black and mild  Vaping  Use   Vaping Use: Every day  Substance Use Topics   Alcohol use: No   Drug use: No    Allergies: No Known Allergies  Medications Prior to Admission  Medication Sig Dispense Refill Last Dose   sertraline (ZOLOFT) 50 MG tablet Take 1 tablet (50 mg total) by mouth daily. 30 tablet 5     Review of Systems  Constitutional:  Negative for chills, diaphoresis, fatigue and fever.  Eyes:  Negative for visual disturbance.  Respiratory:  Negative for shortness of breath.   Cardiovascular:  Negative for chest pain.  Gastrointestinal:  Positive for abdominal pain and rectal pain. Negative for constipation, diarrhea, nausea and vomiting.  Genitourinary:  Negative for dysuria, flank pain, frequency, pelvic pain, urgency, vaginal bleeding and vaginal discharge.  Neurological:  Negative for dizziness, weakness, light-headedness and headaches.   Physical Exam   Blood pressure (!) 100/57, pulse 61, temperature 98.4 F (36.9 C), temperature source Oral, resp. rate 16, weight 76.4 kg, last menstrual period 07/25/2021, SpO2 100 %.  Patient Vitals for the past 24 hrs:  BP Temp Temp src Pulse Resp SpO2 Weight  08/10/21 1513 (!) 100/57 98.4  F (36.9 C) Oral 61 16 100 % 76.4 kg   Physical Exam Vitals and nursing note reviewed.  Constitutional:      General: She is not in acute distress.    Appearance: Normal appearance. She is not ill-appearing, toxic-appearing or diaphoretic.  HENT:     Head: Normocephalic and atraumatic.  Pulmonary:     Effort: Pulmonary effort is normal.  Neurological:     Mental Status: She is alert and oriented to person, place, and time.  Psychiatric:        Mood and Affect: Mood normal.        Behavior: Behavior normal.        Thought Content: Thought content normal.        Judgment: Judgment normal.   Results for orders placed or performed during the hospital encounter of 08/10/21 (from the past 24 hour(s))  hCG, quantitative, pregnancy     Status: Abnormal    Collection Time: 08/10/21  3:10 PM  Result Value Ref Range   hCG, Beta Chain, Quant, S 8,249 (H) <5 mIU/mL  CBC with Differential/Platelet     Status: None   Collection Time: 08/10/21  3:10 PM  Result Value Ref Range   WBC 9.4 4.0 - 10.5 K/uL   RBC 4.04 3.87 - 5.11 MIL/uL   Hemoglobin 12.4 12.0 - 15.0 g/dL   HCT 36.8 36.0 - 46.0 %   MCV 91.1 80.0 - 100.0 fL   MCH 30.7 26.0 - 34.0 pg   MCHC 33.7 30.0 - 36.0 g/dL   RDW 13.5 11.5 - 15.5 %   Platelets 263 150 - 400 K/uL   nRBC 0.0 0.0 - 0.2 %   Neutrophils Relative % 75 %   Neutro Abs 7.0 1.7 - 7.7 K/uL   Lymphocytes Relative 21 %   Lymphs Abs 1.9 0.7 - 4.0 K/uL   Monocytes Relative 4 %   Monocytes Absolute 0.4 0.1 - 1.0 K/uL   Eosinophils Relative 0 %   Eosinophils Absolute 0.0 0.0 - 0.5 K/uL   Basophils Relative 0 %   Basophils Absolute 0.0 0.0 - 0.1 K/uL   Immature Granulocytes 0 %   Abs Immature Granulocytes 0.03 0.00 - 0.07 K/uL  Comprehensive metabolic panel     Status: None   Collection Time: 08/10/21  3:10 PM  Result Value Ref Range   Sodium 135 135 - 145 mmol/L   Potassium 3.7 3.5 - 5.1 mmol/L   Chloride 104 98 - 111 mmol/L   CO2 24 22 - 32 mmol/L   Glucose, Bld 84 70 - 99 mg/dL   BUN 12 6 - 20 mg/dL   Creatinine, Ser 0.81 0.44 - 1.00 mg/dL   Calcium 9.6 8.9 - 10.3 mg/dL   Total Protein 7.8 6.5 - 8.1 g/dL   Albumin 4.3 3.5 - 5.0 g/dL   AST 19 15 - 41 U/L   ALT 14 0 - 44 U/L   Alkaline Phosphatase 68 38 - 126 U/L   Total Bilirubin 0.6 0.3 - 1.2 mg/dL   GFR, Estimated >60 >60 mL/min   Anion gap 7 5 - 15   US OB LESS THAN 14 WEEKS WITH OB TRANSVAGINAL  Result Date: 08/10/2021 CLINICAL DATA:  A 21 year old female presents for evaluation of LEFT ectopic pregnancy with continued pain. Current beta hCG 8,249, previously 5,962. Gestational age by last menstrual cycle 2 weeks 2 days by report. EXAM: OBSTETRIC <14 WK [place] AND TRANSVAGINAL OB US TECHNIQUE: Transvaginal ultrasound was performed for complete  evaluation of the  gestation as well as the maternal uterus, adnexal regions, and pelvic cul-de-sac. COMPARISON:  Comparison is made with August 05, 2021. FINDINGS: Intrauterine gestational sac: No signs of intrauterine gestation as before. LEFT adnexal ectopic pregnancy is seen adjacent to the LEFT ovary as before but with interval enlargement and now with visible fetal pole. Yolk sac:  Visualized Embryo:  Visualized Cardiac Activity: Visualized Heart Rate: 70 bpm CRL:   2.2 mm   5 w 5 d Maternal uterus/adnexae: Mild endometrial thickening slightly less pronounced than on previous imaging. Free fluid in the pelvis of similar volume to the prior study mildly heterogeneous echoes within the fluid in the cul-de-sac. RIGHT ovary is unremarkable. LEFT ovary is adjacent to an enlarging ectopic that displays a heartbeat and now has a visible fetal pole. Current measurements of the ectopic pregnancy 3 x 1.8 x 1.9 cm as compared to 1.7 x 1.4 x 1.4 cm. IMPRESSION: LEFT adnexal ectopic pregnancy now with visible fetal pole and signs of heartbeat, associated with enlargement and increase in echogenic rim. Presence of heterogeneous fluid in the cul-de-sac remains highly suspicious for rupture. This is not substantially changed in volume compared to the previous exam but perhaps slightly more complex. Critical Value/emergent results were called by telephone at the time of interpretation on 08/10/2021 at 5:13 pm to provider Valeria Boza , who verbally acknowledged these results. Electronically Signed   By: Zetta Bills M.D.   On: 08/10/2021 17:14   US OB LESS THAN 14 WEEKS WITH OB TRANSVAGINAL  Result Date: 08/05/2021 CLINICAL DATA:  Vaginal bleeding, pelvic cramping, LMP 07/25/2021 EXAM: OBSTETRIC <14 WK [place] AND TRANSVAGINAL OB US TECHNIQUE: Both transabdominal and transvaginal ultrasound examinations were performed for complete evaluation of the gestation as well as the maternal uterus, adnexal regions, and pelvic cul-de-sac. Transvaginal  technique was performed to assess early pregnancy. COMPARISON:  None. FINDINGS: Intrauterine gestational sac: None identified Maternal uterus/adnexae: The uterus is anteverted. No intrarenal masses are seen. The cervix is closed and is unremarkable. The endometrial stripe is uniform measuring 8 mm in thickness. The right ovary is unremarkable. Immediately adjacent to the left ovary is a cystic structure demonstrating echogenic margins and a a internal yolk sac compatible with an ectopic gestational sac either within or immediately adjacent to the left ovary. This is best appreciated on cine images. There is a moderate amount of complex appearing fluid within the cul-de-sac demonstrating echogenic internal debris best appreciated on image # 19 likely representing blood product in the setting of a ruptured ectopic pregnancy. IMPRESSION: Extrauterine gestational sac immediately adjacent or within the left ovary with moderate complex appearing fluid within the pelvis likely representing blood product in keeping with a ruptured ectopic pregnancy. Electronically Signed   By: Fidela Salisbury M.D.   On: 08/05/2021 21:39    MAU Course  Procedures  MDM -pt with live ectopic pregnancy diagnosed on [place] -Dr. Elgie Congo notified -prepare for OR  Assessment and Plan   1. Ectopic pregnancy of left ovary    -prepare for OR  Gerrie Nordmann Ana Horton 08/10/2021, 5:42 PM

## 2021-08-10 NOTE — H&P (Signed)
[facility] Attending History and Physical  Sandi Ana Horton [place] is a 21 y.o. G1P0000 at 5261w2d who presented to MAU today for evaluation of pain with previously diagnosed ectopic pregnancy treated with methotrexate earlier in the week.  She was given methotrexate on 2/6 and this is the first day she has been seen since then.  She came in just for a check on her condition.  The patient does note intermittent sharp left sided pain.  Positive nausea also noted.    Today's ultrasound shows a viable left ectopic pregnancy.  Past Medical History:  Diagnosis Date   Medical history non-contributory    Past Surgical History:  Procedure Laterality Date   NO PAST SURGERIES     OB History  Gravida Para Term Preterm AB Living  1 0 0 0 0 0  SAB IAB Ectopic Multiple Live Births  0 0 0 0 0    # Outcome Date GA Lbr Len/2nd Weight Sex Delivery Anes PTL Lv  1 Current           Patient denies any other pertinent gynecologic issues.  No current facility-administered medications on file prior to encounter.   Current Outpatient Medications on File Prior to Encounter  Medication Sig Dispense Refill   sertraline (ZOLOFT) 50 MG tablet Take 1 tablet (50 mg total) by mouth daily. 30 tablet 5   No Known Allergies  Social History:   reports that she has been smoking. She has never used smokeless tobacco. She reports that she does not drink alcohol and does not use drugs. Family History  Problem Relation Age of Onset   Hypertension Father    Thyroid disease Mother    Soc Hs update.  Pt drinks occasional alcohol and daily THC Review of Systems: Pertinent items noted in HPI and remainder of comprehensive ROS otherwise negative.  PHYSICAL EXAM: Blood pressure (!) 100/57, pulse 61, temperature 98.4 F (36.9 C), temperature source Oral, resp. rate 16, weight 76.4 kg, last menstrual period 07/25/2021, SpO2 100 %. CONSTITUTIONAL: Well-developed, well-nourished female in no acute  distress.  HENT:  Normocephalic, atraumatic, External right and left ear normal. Oropharynx is clear and moist EYES: Conjunctivae and EOM are normal.  NECK: Normal range of motion, supple, no masses SKIN: Skin is warm and dry. No rash noted. Not diaphoretic. No erythema. No pallor. NEUROLOGIC: Alert and oriented to person, place, and time. Normal reflexes, muscle tone coordination. No cranial nerve deficit noted. PSYCHIATRIC: Normal mood and affect. Normal behavior. Normal judgment and thought content. CARDIOVASCULAR: Normal heart rate noted, regular rhythm RESPIRATORY: Effort and breath sounds normal, no problems with respiration noted ABDOMEN: Soft, mildly tender in left lower quadrant, appears slightly distended but patent notes this is her usual.. PELVIC: deferred MUSCULOSKELETAL: Normal range of motion. No tenderness.  No cyanosis, clubbing, or edema.  2+ distal pulses.  Labs: Results for orders placed or performed during the hospital encounter of 08/10/21 (from the past 336 hour(s))  hCG, quantitative, pregnancy   Collection Time: 08/10/21  3:10 PM  Result Value Ref Range   hCG, Beta Chain, Quant, S 8,249 (H) <5 mIU/mL  CBC with Differential/Platelet   Collection Time: 08/10/21  3:10 PM  Result Value Ref Range   WBC 9.4 4.0 - 10.5 K/uL   RBC 4.04 3.87 - 5.11 MIL/uL   Hemoglobin 12.4 12.0 - 15.0 g/dL   HCT 16.136.8 09.636.0 - 04.546.0 %   MCV 91.1 80.0 - 100.0 fL   MCH 30.7 26.0 - 34.0 pg  MCHC 33.7 30.0 - 36.0 g/dL   RDW 09.3 23.5 - 57.3 %   Platelets 263 150 - 400 K/uL   nRBC 0.0 0.0 - 0.2 %   Neutrophils Relative % 75 %   Neutro Abs 7.0 1.7 - 7.7 K/uL   Lymphocytes Relative 21 %   Lymphs Abs 1.9 0.7 - 4.0 K/uL   Monocytes Relative 4 %   Monocytes Absolute 0.4 0.1 - 1.0 K/uL   Eosinophils Relative 0 %   Eosinophils Absolute 0.0 0.0 - 0.5 K/uL   Basophils Relative 0 %   Basophils Absolute 0.0 0.0 - 0.1 K/uL   Immature Granulocytes 0 %   Abs Immature Granulocytes 0.03 0.00 - 0.07  K/uL  Comprehensive metabolic panel   Collection Time: 08/10/21  3:10 PM  Result Value Ref Range   Sodium 135 135 - 145 mmol/L   Potassium 3.7 3.5 - 5.1 mmol/L   Chloride 104 98 - 111 mmol/L   CO2 24 22 - 32 mmol/L   Glucose, Bld 84 70 - 99 mg/dL   BUN 12 6 - 20 mg/dL   Creatinine, Ser 2.20 0.44 - 1.00 mg/dL   Calcium 9.6 8.9 - 25.4 mg/dL   Total Protein 7.8 6.5 - 8.1 g/dL   Albumin 4.3 3.5 - 5.0 g/dL   AST 19 15 - 41 U/L   ALT 14 0 - 44 U/L   Alkaline Phosphatase 68 38 - 126 U/L   Total Bilirubin 0.6 0.3 - 1.2 mg/dL   GFR, Estimated >27 >06 mL/min   Anion gap 7 5 - 15  Type and screen [facility]   Collection Time: 08/10/21  4:00 PM  Result Value Ref Range   ABO/RH(D) PENDING    Antibody Screen PENDING    Sample Expiration      08/13/2021,2359 Performed at [facility], [address], [place], [place] [address]   Results for orders placed or performed during the hospital encounter of 08/05/21 (from the past 336 hour(s))  CBC   Collection Time: 08/05/21  8:52 PM  Result Value Ref Range   WBC 9.5 4.0 - 10.5 K/uL   RBC 4.04 3.87 - 5.11 MIL/uL   Hemoglobin 12.0 12.0 - 15.0 g/dL   HCT 83.1 51.7 - 61.6 %   MCV 92.1 80.0 - 100.0 fL   MCH 29.7 26.0 - 34.0 pg   MCHC 32.3 30.0 - 36.0 g/dL   RDW 07.3 71.0 - 62.6 %   Platelets 283 150 - 400 K/uL   nRBC 0.0 0.0 - 0.2 %  hCG, quantitative, pregnancy   Collection Time: 08/05/21  8:52 PM  Result Value Ref Range   hCG, Beta Chain, Quant, S 5,962 (H) <5 mIU/mL  Comprehensive metabolic panel   Collection Time: 08/05/21  8:52 PM  Result Value Ref Range   Sodium 136 135 - 145 mmol/L   Potassium 3.6 3.5 - 5.1 mmol/L   Chloride 103 98 - 111 mmol/L   CO2 24 22 - 32 mmol/L   Glucose, Bld 84 70 - 99 mg/dL   BUN 8 6 - 20 mg/dL   Creatinine, Ser 9.48 0.44 - 1.00 mg/dL   Calcium 9.4 8.9 - 54.6 mg/dL   Total Protein 7.1 6.5 - 8.1 g/dL   Albumin 4.1 3.5 - 5.0 g/dL   AST 16 15 - 41 U/L   ALT 11 0 - 44 U/L    Alkaline Phosphatase 65 38 - 126 U/L   Total Bilirubin 0.3  0.3 - 1.2 mg/dL   GFR, Estimated >70 >01 mL/min   Anion gap 9 5 - 15  ABO/Rh   Collection Time: 08/05/21  8:52 PM  Result Value Ref Range   ABO/RH(D) A POS    No rh immune globuloin      NOT A RH IMMUNE GLOBULIN CANDIDATE, PT RH POSITIVE Performed at [facility], [address] [place], [place] [address]   Results for orders placed or performed during the hospital encounter of 08/04/21 (from the past 336 hour(s))  Resp Panel by RT-PCR (Flu A&B, Covid) Nasopharyngeal Swab   Collection Time: 08/04/21  7:57 PM   Specimen: Nasopharyngeal Swab; Nasopharyngeal(NP) swabs in vial transport medium  Result Value Ref Range   SARS Coronavirus 2 by RT PCR NEGATIVE NEGATIVE   Influenza A by PCR NEGATIVE NEGATIVE   Influenza B by PCR NEGATIVE NEGATIVE  Pregnancy, urine   Collection Time: 08/04/21  9:00 PM  Result Value Ref Range   Preg Test, Ur POSITIVE (A) NEGATIVE    Imaging Studies: US OB LESS THAN 14 WEEKS WITH OB TRANSVAGINAL  Result Date: 08/10/2021 CLINICAL DATA:  A 21 year old female presents for evaluation of LEFT ectopic pregnancy with continued pain. Current beta hCG 8,249, previously 5,962. Gestational age by last menstrual cycle 2 weeks 2 days by report. EXAM: OBSTETRIC <14 WK [place] AND TRANSVAGINAL OB US TECHNIQUE: Transvaginal ultrasound was performed for complete evaluation of the gestation as well as the maternal uterus, adnexal regions, and pelvic cul-de-sac. COMPARISON:  Comparison is made with August 05, 2021. FINDINGS: Intrauterine gestational sac: No signs of intrauterine gestation as before. LEFT adnexal ectopic pregnancy is seen adjacent to the LEFT ovary as before but with interval enlargement and now with visible fetal pole. Yolk sac:  Visualized Embryo:  Visualized Cardiac Activity: Visualized Heart Rate: 70 bpm CRL:   2.2 mm   5 w 5 d Maternal uterus/adnexae: Mild endometrial thickening slightly less pronounced  than on previous imaging. Free fluid in the pelvis of similar volume to the prior study mildly heterogeneous echoes within the fluid in the cul-de-sac. RIGHT ovary is unremarkable. LEFT ovary is adjacent to an enlarging ectopic that displays a heartbeat and now has a visible fetal pole. Current measurements of the ectopic pregnancy 3 x 1.8 x 1.9 cm as compared to 1.7 x 1.4 x 1.4 cm. IMPRESSION: LEFT adnexal ectopic pregnancy now with visible fetal pole and signs of heartbeat, associated with enlargement and increase in echogenic rim. Presence of heterogeneous fluid in the cul-de-sac remains highly suspicious for rupture. This is not substantially changed in volume compared to the previous exam but perhaps slightly more complex. Critical Value/emergent results were called by telephone at the time of interpretation on 08/10/2021 at 5:13 pm to provider NICOLE NUGENT , who verbally acknowledged these results. Electronically Signed   By: Donzetta Kohut M.D.   On: 08/10/2021 17:14   US OB LESS THAN 14 WEEKS WITH OB TRANSVAGINAL  Result Date: 08/05/2021 CLINICAL DATA:  Vaginal bleeding, pelvic cramping, LMP 07/25/2021 EXAM: OBSTETRIC <14 WK [place] AND TRANSVAGINAL OB US TECHNIQUE: Both transabdominal and transvaginal ultrasound examinations were performed for complete evaluation of the gestation as well as the maternal uterus, adnexal regions, and pelvic cul-de-sac. Transvaginal technique was performed to assess early pregnancy. COMPARISON:  None. FINDINGS: Intrauterine gestational sac: None identified Maternal uterus/adnexae: The uterus is anteverted. No intrarenal masses are seen. The cervix is closed and is unremarkable. The endometrial stripe is uniform measuring 8 mm in  thickness. The right ovary is unremarkable. Immediately adjacent to the left ovary is a cystic structure demonstrating echogenic margins and a a internal yolk sac compatible with an ectopic gestational sac either within or immediately adjacent to the  left ovary. This is best appreciated on cine images. There is a moderate amount of complex appearing fluid within the cul-de-sac demonstrating echogenic internal debris best appreciated on image # 19 likely representing blood product in the setting of a ruptured ectopic pregnancy. IMPRESSION: Extrauterine gestational sac immediately adjacent or within the left ovary with moderate complex appearing fluid within the pelvis likely representing blood product in keeping with a ruptured ectopic pregnancy. Electronically Signed   By: Helyn Numbers M.D.   On: 08/05/2021 21:39    Assessment: Active Problems:   Ectopic pregnancy, tubal   Plan: Admit for surgery. Pt consented for operative laparoscopy with left salpingectomy. Risks and benefits of the procedure given including bleeding, infection, involvement of other organs including bladder and bowel. She understands the affected tube will be removed. Pt understands and wishes to proceed.    Mariel Aloe, MD, FACOG Obstetrician & Gynecologist, [facility] for [facility], [facility]

## 2021-08-10 NOTE — Anesthesia Procedure Notes (Signed)
Procedure Name: Intubation Date/Time: 08/10/2021 6:44 PM Performed by: Asher Muir, CRNA Pre-anesthesia Checklist: Patient identified, Emergency Drugs available, Suction available and Patient being monitored Patient Re-evaluated:Patient Re-evaluated prior to induction Oxygen Delivery Method: Circle system utilized and Simple face mask Preoxygenation: Pre-oxygenation with 100% oxygen Induction Type: IV induction Ventilation: Mask ventilation without difficulty Laryngoscope Size: Mac and 3 Grade View: Grade II Tube type: Oral Tube size: 7.0 mm Number of attempts: 1 Airway Equipment and Method: Stylet Placement Confirmation: ETT inserted through vocal cords under direct vision, positive ETCO2 and breath sounds checked- equal and bilateral Secured at: 20 (right lip) cm Tube secured with: Tape Dental Injury: Teeth and Oropharynx as per pre-operative assessment

## 2021-08-10 NOTE — Anesthesia Preprocedure Evaluation (Signed)
Anesthesia Evaluation  Patient identified by MRN, date of birth, ID band Patient awake    Reviewed: Allergy & Precautions, H&P , NPO status , Patient's Chart, lab work & pertinent test results  Airway Mallampati: II   Neck ROM: full    Dental   Pulmonary Current Smoker,    breath sounds clear to auscultation       Cardiovascular negative cardio ROS   Rhythm:regular Rate:Normal     Neuro/Psych PSYCHIATRIC DISORDERS Anxiety Depression    GI/Hepatic   Endo/Other    Renal/GU      Musculoskeletal   Abdominal   Peds  Hematology   Anesthesia Other Findings   Reproductive/Obstetrics Ectopic pregnancy                             Anesthesia Physical Anesthesia Plan  ASA: 2 and emergent  Anesthesia Plan: General   Post-op Pain Management:    Induction: Intravenous  PONV Risk Score and Plan: 2 and Ondansetron, Dexamethasone, Midazolam and Treatment may vary due to age or medical condition  Airway Management Planned: Oral ETT  Additional Equipment:   Intra-op Plan:   Post-operative Plan: Extubation in OR  Informed Consent: I have reviewed the patients History and Physical, chart, labs and discussed the procedure including the risks, benefits and alternatives for the proposed anesthesia with the patient or authorized representative who has indicated his/her understanding and acceptance.     Dental advisory given  Plan Discussed with: CRNA, Anesthesiologist and Surgeon  Anesthesia Plan Comments:         Anesthesia Quick Evaluation

## 2021-08-10 NOTE — Op Note (Signed)
Ana Horton PROCEDURE DATE: 08/10/2021  PREOPERATIVE DIAGNOSES: left suspected ruptured ectopic pregnancy POSTOPERATIVE DIAGNOSES: The same PROCEDURE: operative laparoscopy, left salpingectomy SURGEON:  Mariel Aloe, MD ASSISTANT: n/a ANESTHESIOLOGY TEAM: Anesthesiologist: Achille Rich, MD CRNA: Algis Greenhouse, CRNA; Modena Nunnery M, CRNA  INDICATIONS: 21 y.o. G1P0000 with aforementioned preoperative diagnoses here today for definitive surgical management.   Risks of surgery were discussed with the patient including but not limited to: bleeding which may require transfusion or reoperation; infection which may require antibiotics; injury to bowel, bladder, ureters or other surrounding organs; need for additional procedures including laparotomy; thromboembolic phenomenon, incisional problems and other postoperative/anesthesia complications. Written informed consent was obtained.    FINDINGS:  large left sided ectopic taking up the majority of the tube.  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  She was then taken to the operating room where general anesthesia was administered and was found to be adequate.  She was placed in the dorsal lithotomy position, and was prepped and draped in a sterile manner.  A Foley catheter was inserted into her bladder and attached to constant drainage. A timeout was performed to verify patient and procedure. A uterine manipulator was then advanced into the uterus. Attention was then turned to the patient's abdomen where a 5-mm skin incision was made in the  umbilical fold.    The 5 mm Optiview port was advanced under visualization with the endoscope until the peritoneum was entered. The introducer was removed and the camera was replaced within the port to confirm intraperitoneum placement. Once this was accomplished, the carbon dioxide gas was insufflated with an opening pressure of 15 mm Hg. The abdomen was visualized and survey of the abdomen showed atraumatic entry. Survey of the abdomen and pelvis were noted as above.     A 10 mm port was placed into the left lower abdomen 2 cm proximal and median from the left ASIS after incision with a scalpel. The port was advanced under direct visualization.   A 5 mm port was placed in the right lower abdomen 2 cm proximal and medical to the right ASIS after incision with the scalpel. The port was advanced under direct visualization.   The patient was placed into trendelenburg and the left tube was grasped using an atraumatic grasper. The fallopian tube was removed using a 5 mm Ligasure to ligate and transect along the mesosalpinx just inferior to the tube. Hemostasis noted at ligation site. The tube was placed into a endocatch  bag and removed from the abdomen. The pelvis was irrigated and suctioned with hemostasis noted at site. The right tube and ovary were inspected and noted to have filmy adhesions to the sidewall and pelvis. The abdomen was again inspected and no bleeding or other abnormalities noted. At this point the procedure was completed and the trocars were removed from the abdomen. The pneumoperitoneum was removed as much as possible. The fascia of the left 10 mm port was closed using 0-Vicryl. The skin of all ports was closed using 4-0 Monocryl and dermabond.   The uterine manipulator was removed and no bleeding noted at the site of the tenaculum. The patient  was extubated without difficulty and taken to PACU in stable condition. The patient will be discharged to home as per PACU criteria.  Routine  postoperative instructions given.  She was prescribed Percocet and Ibuprofen.  She will follow up in the clinic in 2-3  weeks for postoperative evaluation.   Mariel Aloe, MD

## 2021-08-10 NOTE — MAU Note (Signed)
Pt had MTx 6 days ago. Stated she had an appointment today earlier but was not able to make it. Started having lower abd pain and pain in her anus on the way here. Pain is campy and comes and goes. Stated she had an episode like she felt like she was going to pass out but feels better now. Denies any vag bleeding or discharge.

## 2021-08-10 NOTE — Transfer of Care (Signed)
Immediate Anesthesia Transfer of Care Note  Patient: Ana Horton  Procedure(s) Performed: LAPAROSCOPIC LEFT SALPINGECTOMY WITH REMOVAL OF ECTOPIC PREGNANCY (Left)  Patient Location: PACU  Anesthesia Type:General  Level of Consciousness: awake, alert  and oriented  Airway & Oxygen Therapy: Patient Spontanous Breathing and Patient connected to face mask oxygen  Post-op Assessment: Report given to RN and Post -op Vital signs reviewed and stable  Post vital signs: Reviewed and stable  Last Vitals:  Vitals Value Taken Time  BP 116/75 08/10/21 2052  Temp    Pulse 51 08/10/21 2054  Resp 13 08/10/21 2054  SpO2 97 % 08/10/21 2054  Vitals shown include unvalidated device data.  Last Pain:  Vitals:   08/10/21 2040  TempSrc:   PainSc: 0-No pain         Complications: No notable events documented.

## 2021-08-12 ENCOUNTER — Encounter (HOSPITAL_COMMUNITY): Payer: Self-pay | Admitting: Obstetrics and Gynecology

## 2021-08-13 LAB — SURGICAL PATHOLOGY

## 2021-08-13 NOTE — Anesthesia Postprocedure Evaluation (Signed)
Anesthesia Post Note  Patient: Ana Horton  Procedure(s) Performed: LAPAROSCOPIC LEFT SALPINGECTOMY WITH REMOVAL OF ECTOPIC PREGNANCY (Left)     Patient location during evaluation: PACU Anesthesia Type: General Level of consciousness: awake and alert Pain management: pain level controlled Vital Signs Assessment: post-procedure vital signs reviewed and stable Respiratory status: spontaneous breathing, nonlabored ventilation, respiratory function stable and patient connected to nasal cannula oxygen Cardiovascular status: blood pressure returned to baseline and stable Postop Assessment: no apparent nausea or vomiting Anesthetic complications: no   No notable events documented.  Last Vitals:  Vitals:   08/10/21 2055 08/10/21 2110  BP: 116/75 107/74  Pulse: (!) 54 68  Resp: 12 17  Temp: 36.8 C 36.8 C  SpO2: 97% 98%    Last Pain:  Vitals:   08/10/21 2110  TempSrc:   PainSc: 0-No pain                 Aleayah Chico S

## 2021-08-15 ENCOUNTER — Telehealth: Payer: Self-pay

## 2021-08-15 NOTE — Telephone Encounter (Signed)
Patient called and left voicemail stating she had recently had a surgery for ectopic pregnancy and she needed proof of surgery for her employer. I tried to return patient's call twice and was unable to reach patient or leave voicemail. Patient's voicemail box stated it was full. I sent patient a message on mychart informing her how to obtain proof of her surgery for her employer.   Alesia Richards, RN 08/15/21

## 2021-08-17 ENCOUNTER — Other Ambulatory Visit: Payer: Self-pay

## 2021-08-17 ENCOUNTER — Inpatient Hospital Stay (HOSPITAL_COMMUNITY)
Admission: AD | Admit: 2021-08-17 | Discharge: 2021-08-17 | Disposition: A | Discharge status: Home / Self Care | Payer: Medicaid Other | Attending: Obstetrics & Gynecology | Admitting: Obstetrics & Gynecology

## 2021-08-17 DIAGNOSIS — Z711 Person with feared health complaint in whom no diagnosis is made: Secondary | ICD-10-CM | POA: Insufficient documentation

## 2021-08-17 DIAGNOSIS — Z9889 Other specified postprocedural states: Secondary | ICD-10-CM | POA: Diagnosis not present

## 2021-08-17 DIAGNOSIS — Z8759 Personal history of other complications of pregnancy, childbirth and the puerperium: Secondary | ICD-10-CM

## 2021-08-17 NOTE — MAU Note (Signed)
Pt reports to mau wanting to be cleared to go back to work after surgical procedure that was performed last weekend.  Pt states she was told to come to MAU for follow up by Dr. Elgie Congo.  Reports bleeding has been minimal and pain is well controlled. States her "ph is off and might have BV".

## 2021-08-17 NOTE — MAU Provider Note (Signed)
Event Date/Time   First Provider Initiated Contact with Patient 08/17/21 1738      S Ms. Ana Horton is a 21 y.o. G1P0000 patient who presents to MAU today requesting a letter to return to work. She states she is unable to return without the letter. She reports intermittent cramping and intermittent spotting, none today.   O BP 105/60 (BP Location: Right Arm)    Pulse 79    Temp 98.4 F (36.9 C) (Oral)    Resp 16    LMP 07/25/2021    SpO2 99%  Physical Exam Vitals and nursing note reviewed.  Constitutional:      General: She is not in acute distress.    Appearance: She is well-developed.  HENT:     Head: Normocephalic.  Eyes:     Pupils: Pupils are equal, round, and reactive to light.  Cardiovascular:     Rate and Rhythm: Normal rate and regular rhythm.     Heart sounds: Normal heart sounds.  Pulmonary:     Effort: Pulmonary effort is normal. No respiratory distress.     Breath sounds: Normal breath sounds.  Abdominal:     General: Bowel sounds are normal. There is no distension.     Palpations: Abdomen is soft.     Tenderness: There is no abdominal tenderness.  Skin:    General: Skin is warm and dry.  Neurological:     Mental Status: She is alert and oriented to person, place, and time.  Psychiatric:        Mood and Affect: Mood normal.        Behavior: Behavior normal.        Thought Content: Thought content normal.        Judgment: Judgment normal.    A Medical screening exam complete 1. Physically well but worried   2. Post-operative state   3. Hx of ectopic pregnancy    Letter given to return to work. Patient has follow up scheduled 3/1. Discussed importance of keeping follow up and normalcy of current symptoms.   P -Discharge home in stable condition -Abdominal pain and vaginal bleeding precautions discussed -Patient advised to follow-up with OB as scheduled on 3/1 -Patient may return to MAU as needed or if her condition were to change or  worsen   Rolm Bookbinder, [place] 08/17/2021 5:39 PM

## 2021-08-28 ENCOUNTER — Other Ambulatory Visit: Payer: Self-pay

## 2021-08-28 ENCOUNTER — Ambulatory Visit (INDEPENDENT_AMBULATORY_CARE_PROVIDER_SITE_OTHER): Payer: Medicaid Other | Admitting: Obstetrics & Gynecology

## 2021-08-28 ENCOUNTER — Encounter: Payer: Self-pay | Admitting: Obstetrics & Gynecology

## 2021-08-28 VITALS — BP 107/61 | HR 76 | Wt 169.8 lb

## 2021-08-28 DIAGNOSIS — Z09 Encounter for follow-up examination after completed treatment for conditions other than malignant neoplasm: Secondary | ICD-10-CM

## 2021-08-28 DIAGNOSIS — Z7185 Encounter for immunization safety counseling: Secondary | ICD-10-CM | POA: Diagnosis not present

## 2021-08-28 NOTE — Progress Notes (Signed)
? ? ?  GYNECOLOGY POSTOPERATIVE VISIT NOTE ? ?Subjective:  ?  ? Ana Horton is a 21 y.o. G19P0010 female who presents to the clinic 3 weeks status post  laparoscopic left salpingectomy for ruptured ectopic pregnancy . Eating a regular diet without difficulty. Bowel movements are normal. The patient is not having any pain. Feels occasionally down, has elevated PHQ9/GAD7. ? ?The following portions of the patient's history were reviewed and updated as appropriate: allergies, current medications, past family history, past medical history, past social history, past surgical history, and problem list. ? ?Review of Systems ?Pertinent items noted in HPI and remainder of comprehensive ROS otherwise negative.  ?  ?Objective:  ? ? BP 107/61   Pulse 76   Wt 169 lb 12.8 oz (77 kg)   LMP 08/22/2021 (Exact Date)   Breastfeeding Unknown   BMI 30.08 kg/m?  ?General:  alert and no distress  ?Abdomen: soft, bowel sounds active, non-tender  ?Incision:   healing well, no drainage, no erythema, no hernia, no seroma, no swelling, no dehiscence, incision well approximated  ?  ?08/10/2021 Surgical Pathology ?FALLOPIAN TUBE, LEFT WITH ECTOPIC PREGNANCY:  ?Findings consistent with ectopic tubal pregnancy.  ?  ?Assessment:  ? ? Doing well postoperatively. ?Operative findings again reviewed. Pathology report discussed.  ?  ?Plan:  ? ? Healing well, no postoperative concerns ?Declines [facility] referral, told to let us know if she chnages her mind ?Declines prescribed birth control for now, will use condoms and fertility awareness methods ?Unsure about HPV vaccination status, will find out and let us know. Information given to her to reviwe about this. Pap smears not recommended until age 33  ? ?I spent 20 minutes dedicated to the care of this patient including pre-visit review of records, face to face time with the patient discussing her conditions and treatments. ? ?Jaynie Collins, MD, FACOG ?Obstetrician Heritage manager, Faculty  Practice ?Center for [employer], [facility] ? ? ?

## 2021-08-28 NOTE — Progress Notes (Signed)
Patient has elevated PHQ9/GAD7. Declines [facility].  ? ?Alesia Richards, RN ?08/28/21 ?

## 2021-10-06 ENCOUNTER — Inpatient Hospital Stay (HOSPITAL_COMMUNITY)
Admission: AD | Admit: 2021-10-06 | Discharge: 2021-10-06 | Disposition: A | Discharge status: Home / Self Care | Payer: Medicaid Other | Attending: Obstetrics and Gynecology | Admitting: Obstetrics and Gynecology

## 2021-10-06 ENCOUNTER — Other Ambulatory Visit: Payer: Self-pay

## 2021-10-06 ENCOUNTER — Encounter (HOSPITAL_COMMUNITY): Payer: Self-pay | Admitting: Obstetrics and Gynecology

## 2021-10-06 ENCOUNTER — Inpatient Hospital Stay (HOSPITAL_COMMUNITY): Payer: Medicaid Other

## 2021-10-06 DIAGNOSIS — O26891 Other specified pregnancy related conditions, first trimester: Secondary | ICD-10-CM | POA: Insufficient documentation

## 2021-10-06 DIAGNOSIS — O00101 Right tubal pregnancy without intrauterine pregnancy: Secondary | ICD-10-CM | POA: Insufficient documentation

## 2021-10-06 DIAGNOSIS — Z3200 Encounter for pregnancy test, result unknown: Secondary | ICD-10-CM | POA: Diagnosis not present

## 2021-10-06 DIAGNOSIS — Z9079 Acquired absence of other genital organ(s): Secondary | ICD-10-CM | POA: Diagnosis not present

## 2021-10-06 DIAGNOSIS — O4690 Antepartum hemorrhage, unspecified, unspecified trimester: Secondary | ICD-10-CM | POA: Diagnosis not present

## 2021-10-06 DIAGNOSIS — Z3A01 Less than 8 weeks gestation of pregnancy: Secondary | ICD-10-CM

## 2021-10-06 DIAGNOSIS — R1031 Right lower quadrant pain: Secondary | ICD-10-CM | POA: Insufficient documentation

## 2021-10-06 DIAGNOSIS — Z6710 Type A blood, Rh positive: Secondary | ICD-10-CM | POA: Insufficient documentation

## 2021-10-06 LAB — URINALYSIS, ROUTINE W REFLEX MICROSCOPIC
Bilirubin Urine: NEGATIVE
Glucose, UA: NEGATIVE mg/dL
Ketones, ur: NEGATIVE mg/dL
Nitrite: NEGATIVE
Protein, ur: NEGATIVE mg/dL
Specific Gravity, Urine: 1.018 (ref 1.005–1.030)
WBC, UA: >50 WBC/hpf — ABNORMAL HIGH (ref 0–5)
pH: 5 (ref 5.0–8.0)

## 2021-10-06 LAB — COMPREHENSIVE METABOLIC PANEL
ALT: 8 U/L (ref 0–44)
AST: 14 U/L — ABNORMAL LOW (ref 15–41)
Albumin: 4 g/dL (ref 3.5–5.0)
Alkaline Phosphatase: 58 U/L (ref 38–126)
Anion gap: 6 (ref 5–15)
BUN: 6 mg/dL (ref 6–20)
CO2: 24 mmol/L (ref 22–32)
Calcium: 9.1 mg/dL (ref 8.9–10.3)
Chloride: 108 mmol/L (ref 98–111)
Creatinine, Ser: 0.76 mg/dL (ref 0.44–1.00)
GFR, Estimated: >60 mL/min (ref >60)
Glucose, Bld: 89 mg/dL (ref 70–99)
Potassium: 3.7 mmol/L (ref 3.5–5.1)
Sodium: 138 mmol/L (ref 135–145)
Total Bilirubin: 0.2 mg/dL — ABNORMAL LOW (ref 0.3–1.2)
Total Protein: 7 g/dL (ref 6.5–8.1)

## 2021-10-06 LAB — ABO/RH: ABO/RH(D): A POS

## 2021-10-06 LAB — WET PREP, GENITAL
Sperm: NONE SEEN
Trich, Wet Prep: NONE SEEN
WBC, Wet Prep HPF POC: <10 (ref <10)

## 2021-10-06 LAB — HCG, QUANTITATIVE, PREGNANCY: hCG, Beta Chain, Quant, S: 407 m[IU]/mL — ABNORMAL HIGH (ref <5)

## 2021-10-06 LAB — CBC
HCT: 35.1 % — ABNORMAL LOW (ref 36.0–46.0)
Hemoglobin: 11.9 g/dL — ABNORMAL LOW (ref 12.0–15.0)
MCH: 31.1 pg (ref 26.0–34.0)
MCHC: 33.9 g/dL (ref 30.0–36.0)
MCV: 91.6 fL (ref 80.0–100.0)
Platelets: 280 10*3/uL (ref 150–400)
RBC: 3.83 MIL/uL — ABNORMAL LOW (ref 3.87–5.11)
RDW: 13.7 % (ref 11.5–15.5)
WBC: 9 10*3/uL (ref 4.0–10.5)
nRBC: 0 % (ref 0.0–0.2)

## 2021-10-06 LAB — POCT PREGNANCY, URINE: Preg Test, Ur: POSITIVE — AB

## 2021-10-06 MED ORDER — METHOTREXATE FOR ECTOPIC PREGNANCY
50.0000 mg/m2 | Freq: Once | INTRAMUSCULAR | Status: AC
  Administered 2021-10-06: 92.5 mg via INTRAMUSCULAR
  Filled 2021-10-06: qty 10

## 2021-10-06 NOTE — Discharge Instructions (Signed)
You came to the MAU because you had bleeding and right sided belly pain while being early pregnant. We checked your labs and an ultrasound and it unfortunately looks like you have another ectopic pregnancy,this time in the right tube. Your hormone level was fairly low and it did not look like the tube had ruptured so we treated you with the medication methotrexate.  ? ?Please go to the clinic on Thursday 4/13 for your pregnancy hormone level and come back here on Sunday 4/16 to have your levels check. Please return to MAU immediately if you have excruciating belly pain or feel like you are going to pass out. ? ? ?

## 2021-10-06 NOTE — MAU Provider Note (Signed)
?History  ?  ? ?CSN: [id number] ? ?Arrival date and time: 10/06/21 0009 ? ? None  ?  ? ?Chief Complaint  ?Patient presents with  ? Vaginal Bleeding  ? ?HPI ?21 yo G2P0010 with hx of one prior ectopic pregnancy s/p laparoscopic salpingectomy (for ruptured ectopic) who presents with vaginal bleeding and abdominal pain today. LMP 2/15. Had salpingectomy on 2/11. ? ?#Spotting ?Started on 3/31 ?Very light ?Only using one pad a day ?Dark blood with wiping ?No big clots ?Right abdomen pain ?LMP: 08/14/2021 ?Last intercourse two days ago ? ? ?OB History   ? ? Gravida  ?2  ? Para  ?0  ? Term  ?0  ? Preterm  ?0  ? AB  ?1  ? Living  ?0  ?  ? ? SAB  ?0  ? IAB  ?0  ? Ectopic  ?1  ? Multiple  ?0  ? Live Births  ?0  ?   ?  ?  ? ? ?Past Medical History:  ?Diagnosis Date  ? Medical history non-contributory   ? ? ?Past Surgical History:  ?Procedure Laterality Date  ? LAPAROSCOPIC UNILATERAL SALPINGECTOMY Left 08/10/2021  ? Procedure: LAPAROSCOPIC LEFT SALPINGECTOMY WITH REMOVAL OF ECTOPIC PREGNANCY;  Surgeon: Griffin Basil, MD;  Location: [facility];  Service: Gynecology;  Laterality: Left;  ? ? ?Family History  ?Problem Relation Age of Onset  ? Hypertension Father   ? Thyroid disease Mother   ? ? ?Social History  ? ?Tobacco Use  ? Smoking status: Some Days  ? Smokeless tobacco: Never  ? Tobacco comments:  ?  black and mild  ?Vaping Use  ? Vaping Use: Some days  ?Substance Use Topics  ? Alcohol use: No  ? Drug use: No  ? ? ?Allergies: No Known Allergies ? ?Medications Prior to Admission  ?Medication Sig Dispense Refill Last Dose  ? ibuprofen (ADVIL) 600 MG tablet Take 1 tablet (600 mg total) by mouth every 6 (six) hours as needed for headache, mild pain, moderate pain or cramping. (Patient not taking: Reported on 08/28/2021) 30 tablet 1   ? sertraline (ZOLOFT) 50 MG tablet Take 1 tablet (50 mg total) by mouth daily. (Patient not taking: Reported on 08/28/2021) 30 tablet 5   ? ? ?Review of Systems  ?Constitutional:  Negative for chills and  fever.  ?Respiratory:  Negative for chest tightness and shortness of breath.   ?Cardiovascular:  Negative for chest pain.  ?Gastrointestinal:  Positive for abdominal pain. Negative for diarrhea, nausea and vomiting.  ?Endocrine: Negative for polyuria.  ?Genitourinary:  Negative for dysuria.  ?Musculoskeletal:  Negative for back pain.  ?Hematological:  Negative for adenopathy.  ?Psychiatric/Behavioral:  Negative for agitation.   ?Physical Exam  ? ?Blood pressure (!) 110/49, pulse 78, temperature 98.8 ?F (37.1 ?C), resp. rate 18, height 5\' 4"  (1.626 m), weight 76.7 kg, last menstrual period 08/14/2021, unknown if currently breastfeeding. ? ?Physical Exam ?Vitals and nursing note reviewed. Exam conducted with a chaperone present.  ?HENT:  ?   Head: Normocephalic.  ?Cardiovascular:  ?   Rate and Rhythm: Normal rate.  ?   Pulses: Normal pulses.  ?Pulmonary:  ?   Effort: Pulmonary effort is normal.  ?Abdominal:  ?   General: There is no distension.  ?   Comments: Mild right sided TTP  ?Genitourinary: ?   Comments: Sterile speculum exam done. Small amount of dark red blood seen in vault without any active bleeding out of os ?Musculoskeletal:     ?  General: Normal range of motion.  ?Skin: ?   General: Skin is warm.  ?   Capillary Refill: Capillary refill takes less than 2 seconds.  ?Neurological:  ?   General: No focal deficit present.  ?   Mental Status: She is alert and oriented to person, place, and time.  ?Psychiatric:     ?   Mood and Affect: Mood normal.  ? ? ?MAU Course  ?Procedures ? ?MDM ?[place] ?CLINICAL DATA:  History of prior left adnexal ectopic pregnancy, ?presenting with pelvic pain and vaginal bleeding. ?  ?EXAM: ?OBSTETRIC <14 WK [place] AND TRANSVAGINAL OB US ?  ?TECHNIQUE: ?Both transabdominal and transvaginal ultrasound examinations were ?performed for complete evaluation of the gestation as well as the ?maternal uterus, adnexal regions, and pelvic cul-de-sac. ?Transvaginal technique was performed to assess  early pregnancy. ?  ?COMPARISON:  August 10, 2021 ?  ?FINDINGS: ?Intrauterine gestational sac: None ?  ?Yolk sac:  Not Visualized. ?  ?Embryo:  Not Visualized. ?  ?Cardiac Activity: Not Visualized. ?  ?Heart Rate: N/A  bpm ?  ?Maternal uterus/adnexae: The endometrium measures 6.1 mm in ?thickness. ?  ?The right ovary measures 3.3 cm x 2.4 cm x 2.1 cm and is normal in ?appearance. ?  ?A 1.6 cm x 1.2 cm heterogeneous mildly hyperechoic mass is seen ?within the right adnexa. Vascularity is seen within this region on ?color Doppler evaluation. This represents a new finding when ?compared to the prior study. ?  ?The left ovary measures 3.6 cm x 2.4 cm x 1.9 cm and contains a ?small corpus luteum cyst. ?  ?A moderate amount of pelvic free fluid, containing mildly ?heterogeneous echoes, is again seen within the cul-de-sac. ?  ?IMPRESSION: ?1. No evidence of an intrauterine pregnancy. ?2. Vascular RIGHT adnexal mass, consistent with a RIGHT ectopic ?pregnancy. Correlation with follow-up pelvic ultrasound and serial ?beta HCG levels is recommended. ?3. Mildly complex pelvic free fluid which is seen on the prior ?study. While this is felt to less likely represent sequelae ?associated with rupture of the RIGHT ectopic pregnancy, follow-up ?pelvic ultrasound is recommended to confirm stability. ?  ? ?Assessment and Plan  ? ?Right ectopic pregnancy ?Presented with vaginal spotting and right sided lower abdominal pain in setting of previous left salpingectomy (for left ruptured ectopic). HCG 407. A+ blood type. [place] today with right adnexal mass consistent with ectopic pregnancy. Also some pelvic free fluid but unchanged from prior US (which was done during workup of left ectopic in 07/2021) and per discussion with radiologist Dr. Sherrye Payor less likely to be new ruptured right ectopic (Especially given HCG of 407 and VSS). Discussed with Dr. Rip Harbour and plan to do MTX treatment. ?- CMP ordered and normal ?- MTX administered on 4/9 ?-  Repeat HCG (Day 4) scheduled at [facility] on 4/12 ?- Repeat day 7 HCG scheduled at MAU on 4/15 (since weekend) ?- discussed return precautions in great detail and patient expressed understanding ? ?Renard Matter, MD, MPH ?OB Fellow, Faculty Practice ? ?

## 2021-10-06 NOTE — MAU Note (Signed)
.  Ana Horton is a 21 y.o. at Unknown here in MAU reporting: she had surgery on 08/10/21 for ectopic pregnancy. Had a period on 08/14/21 none in March started spotting tonight took a pregnancy test at home and it was positive. Reports mild cramping ?LMP: 08/14/2021 ?Onset of complaint: tonight ?Pain score: 2 ?Vitals:  ? 10/06/21 0038  ?BP: (!) 110/49  ?Pulse: 78  ?Resp: 18  ?Temp: 98.8 ?F (37.1 ?C)  ?   ?FHT:n/a ?Lab orders placed from triage:  upt/ua ? ?

## 2021-10-07 LAB — CULTURE, OB URINE

## 2021-10-07 LAB — GC/CHLAMYDIA PROBE AMP (~~LOC~~) NOT AT ARMC
Chlamydia: NEGATIVE
Comment: NEGATIVE
Comment: NORMAL
Neisseria Gonorrhea: NEGATIVE

## 2021-10-09 ENCOUNTER — Ambulatory Visit (INDEPENDENT_AMBULATORY_CARE_PROVIDER_SITE_OTHER): Payer: Medicaid Other

## 2021-10-09 VITALS — BP 109/71 | HR 89 | Wt 169.0 lb

## 2021-10-09 DIAGNOSIS — O00102 Left tubal pregnancy without intrauterine pregnancy: Secondary | ICD-10-CM

## 2021-10-09 LAB — BETA HCG QUANT (REF LAB): hCG Quant: 536 m[IU]/mL

## 2021-10-09 NOTE — Progress Notes (Signed)
Pt here today for STAT beta s/p MTX day 4.  Pt reports that her last bleeding episode was yesterday and it is was just spotting.  Pt denies pain.  Pt advised that we call her with results before the end of the day.  However the result of day 7 is what determines the outcome.  Pt verbalized understanding with no further questions.   ? ?Received results for beta that are 536.  Last beta drawn on 10/06/21 was 407.  Reviewed results with Dr. Elgie Congo who recommends that pt continue to follow up with day 7 STAT beta draw.  Pt provided with information on when to go to MAU and provider's recommendation.  Pt verbalized understanding.  ? Mel Almond, RN  ?10/09/21 ?

## 2021-10-10 ENCOUNTER — Other Ambulatory Visit: Payer: Medicaid Other

## 2021-10-12 ENCOUNTER — Other Ambulatory Visit (HOSPITAL_COMMUNITY)
Admission: AD | Admit: 2021-10-12 | Discharge: 2021-10-12 | Disposition: A | Discharge status: Home / Self Care | Payer: Medicaid Other | Source: Ambulatory Visit | Attending: Obstetrics & Gynecology | Admitting: Obstetrics & Gynecology

## 2021-10-12 ENCOUNTER — Inpatient Hospital Stay (HOSPITAL_COMMUNITY)
Admission: AD | Admit: 2021-10-12 | Discharge: 2021-10-12 | Disposition: A | Discharge status: Home / Self Care | Payer: Medicaid Other | Attending: Obstetrics & Gynecology | Admitting: Obstetrics & Gynecology

## 2021-10-12 DIAGNOSIS — Z3A08 8 weeks gestation of pregnancy: Secondary | ICD-10-CM | POA: Insufficient documentation

## 2021-10-12 DIAGNOSIS — O09291 Supervision of pregnancy with other poor reproductive or obstetric history, first trimester: Secondary | ICD-10-CM | POA: Insufficient documentation

## 2021-10-12 DIAGNOSIS — O00101 Right tubal pregnancy without intrauterine pregnancy: Secondary | ICD-10-CM | POA: Diagnosis not present

## 2021-10-12 LAB — COMPREHENSIVE METABOLIC PANEL
ALT: 9 U/L (ref 0–44)
AST: 16 U/L (ref 15–41)
Albumin: 4.2 g/dL (ref 3.5–5.0)
Alkaline Phosphatase: 64 U/L (ref 38–126)
Anion gap: 6 (ref 5–15)
BUN: 9 mg/dL (ref 6–20)
CO2: 23 mmol/L (ref 22–32)
Calcium: 9.4 mg/dL (ref 8.9–10.3)
Chloride: 109 mmol/L (ref 98–111)
Creatinine, Ser: 0.95 mg/dL (ref 0.44–1.00)
GFR, Estimated: >60 mL/min (ref >60)
Glucose, Bld: 89 mg/dL (ref 70–99)
Potassium: 3.9 mmol/L (ref 3.5–5.1)
Sodium: 138 mmol/L (ref 135–145)
Total Bilirubin: 0.5 mg/dL (ref 0.3–1.2)
Total Protein: 7.4 g/dL (ref 6.5–8.1)

## 2021-10-12 LAB — HCG, QUANTITATIVE, PREGNANCY: hCG, Beta Chain, Quant, S: 569 m[IU]/mL — ABNORMAL HIGH (ref <5)

## 2021-10-12 MED ORDER — METHOTREXATE FOR ECTOPIC PREGNANCY
50.0000 mg/m2 | Freq: Once | INTRAMUSCULAR | Status: AC
  Administered 2021-10-12: 92.5 mg via INTRAMUSCULAR
  Filled 2021-10-12: qty 10

## 2021-10-12 NOTE — MAU Note (Signed)
Pt reports to mau for follow up lab work.  Pt denies any pain or bleeding today. 

## 2021-10-12 NOTE — MAU Provider Note (Signed)
History  ? ?Chief Complaint:  Follow-up ? ? ?Ana Horton is  21 y.o. G2P0010 Patient's last menstrual period was 08/14/2021.Marland Kitchen Patient is here for follow up of quantitative HCG and ongoing surveillance of pregnancy status. She is [redacted]w[redacted]d weeks gestation  by LMP.   ? ?Since her last visit, the patient is without new complaint. The patient reports bleeding as  none now.  She denies any pain. ? ?General ROS:  negative ? ?Her previous Quantitative HCG values are: ? ?4/9: 407 MTX  ?4/12: 536 ? ? ?Physical Exam  ? ?Blood pressure 129/71, pulse 85, temperature 98.6 ?F (37 ?C), temperature source Oral, resp. rate 17, last menstrual period 08/14/2021, SpO2 100 %, unknown if currently breastfeeding. ? ?Physical Exam ?Vitals and nursing note reviewed.  ?Constitutional:   ?   General: She is not in acute distress. ?   Appearance: She is well-developed.  ?HENT:  ?   Head: Normocephalic.  ?Eyes:  ?   Pupils: Pupils are equal, round, and reactive to light.  ?Cardiovascular:  ?   Rate and Rhythm: Normal rate and regular rhythm.  ?Pulmonary:  ?   Effort: Pulmonary effort is normal. No respiratory distress.  ?   Breath sounds: Normal breath sounds.  ?Abdominal:  ?   Palpations: Abdomen is soft.  ?   Tenderness: There is no abdominal tenderness.  ?Musculoskeletal:     ?   General: Normal range of motion.  ?   Cervical back: Normal range of motion.  ?Skin: ?   General: Skin is warm and dry.  ?Neurological:  ?   Mental Status: She is alert and oriented to person, place, and time.  ?Psychiatric:     ?   Behavior: Behavior normal.     ?   Thought Content: Thought content normal.     ?   Judgment: Judgment normal.  ? ? ? ?Labs: ?Results for orders placed or performed during the hospital encounter of 10/12/21 (from the past 24 hour(s))  ?hCG, quantitative, pregnancy  ? Collection Time: 10/12/21 12:32 PM  ?Result Value Ref Range  ? hCG, Beta Chain, Quant, S 569 (H) <5 mIU/mL  ?Comprehensive metabolic panel  ? Collection Time: 10/12/21  1:27  PM  ?Result Value Ref Range  ? Sodium 138 135 - 145 mmol/L  ? Potassium 3.9 3.5 - 5.1 mmol/L  ? Chloride 109 98 - 111 mmol/L  ? CO2 23 22 - 32 mmol/L  ? Glucose, Bld 89 70 - 99 mg/dL  ? BUN 9 6 - 20 mg/dL  ? Creatinine, Ser 0.95 0.44 - 1.00 mg/dL  ? Calcium 9.4 8.9 - 10.3 mg/dL  ? Total Protein 7.4 6.5 - 8.1 g/dL  ? Albumin 4.2 3.5 - 5.0 g/dL  ? AST 16 15 - 41 U/L  ? ALT 9 0 - 44 U/L  ? Alkaline Phosphatase 64 38 - 126 U/L  ? Total Bilirubin 0.5 0.3 - 1.2 mg/dL  ? GFR, Estimated >60 >60 mL/min  ? Anion gap 6 5 - 15  ? ? ?Ultrasound Studies:   ?US OB LESS THAN 14 WEEKS WITH OB TRANSVAGINAL ? ?Addendum Date: 10/06/2021   ?ADDENDUM REPORT: 10/06/2021 03:14 ADDENDUM: Results were discussed with Dr. Cy Blamer at 2:52 a.m. [place] on October 06, 2021. Electronically Signed   By: Virgina Norfolk M.D.   On: 10/06/2021 03:14  ? ?Result Date: 10/06/2021 ?CLINICAL DATA:  History of prior left adnexal ectopic pregnancy, presenting with pelvic pain and vaginal bleeding. EXAM: OBSTETRIC <14 WK [place]  AND TRANSVAGINAL OB US TECHNIQUE: Both transabdominal and transvaginal ultrasound examinations were performed for complete evaluation of the gestation as well as the maternal uterus, adnexal regions, and pelvic cul-de-sac. Transvaginal technique was performed to assess early pregnancy. COMPARISON:  August 10, 2021 FINDINGS: Intrauterine gestational sac: None Yolk sac:  Not Visualized. Embryo:  Not Visualized. Cardiac Activity: Not Visualized. Heart Rate: N/A  bpm Maternal uterus/adnexae: The endometrium measures 6.1 mm in thickness. The right ovary measures 3.3 cm x 2.4 cm x 2.1 cm and is normal in appearance. A 1.6 cm x 1.2 cm heterogeneous mildly hyperechoic mass is seen within the right adnexa. Vascularity is seen within this region on color Doppler evaluation. This represents a new finding when compared to the prior study. The left ovary measures 3.6 cm x 2.4 cm x 1.9 cm and contains a small corpus luteum cyst. A moderate amount of pelvic  free fluid, containing mildly heterogeneous echoes, is again seen within the cul-de-sac. IMPRESSION: 1. No evidence of an intrauterine pregnancy. 2. Vascular RIGHT adnexal mass, consistent with a RIGHT ectopic pregnancy. Correlation with follow-up pelvic ultrasound and serial beta HCG levels is recommended. 3. Mildly complex pelvic free fluid which is seen on the prior study. While this is felt to less likely represent sequelae associated with rupture of the RIGHT ectopic pregnancy, follow-up pelvic ultrasound is recommended to confirm stability. Electronically Signed: By: Virgina Norfolk M.D. On: 10/06/2021 02:58   ? ?Assessment: ?  ?1. Right tubal pregnancy without intrauterine pregnancy   ?2. [redacted] weeks gestation of pregnancy   ? ?-Consulted with Dr. Nelda Marseille given rising HCG and hx of ruptured ectopic pregnancy in February. Recommends repeat dose of methotrexate today.  ? ?Reviewed recommendations at length with patient. Patient agreeable to plan of care.  ? ?The risks of methotrexate were reviewed including failure requiring repeat dosing or eventual surgery. She understands that methotrexate involves frequent return visits to monitor lab values and that she remains at risk of ectopic rupture until her beta is less than assay. ?The patient opts to proceed with methotrexate.  She has no history of hepatic or renal dysfunction, has normal BUN/Cr/LFT's/platelets.  She is felt to be reliable for follow-up. Side effects of photosensitivity & GI upset were discussed.  She knows to avoid direct sunlight and abstain from alcohol, NSAIDs and sexual intercourse for two weeks. She was counseled to discontinue any MVI with folic acid. ??She understands to follow up on D4 (4/18) and D7 (4/21) for repeat BHCG and was given the instruction sheet. ??Strict ectopic precautions were reviewed, the patient knows to call with any abdominal pain, vomiting, fainting, or any concerns with her health.  ?Day 0/1 Day 4 Day 7  ?Sunday  Wednesday Saturday  ?Monday Thursday Sunday  ?Tuesday Friday Monday  ?Wednesday Saturday Tuesday  ?Thursday Sunday Wednesday  ?Friday Monday Thursday  ?Saturday Tuesday Friday  ? ? ?Plan: ?-Discharge home in stable condition ?-Strict ectopic precautions discussed ?-Patient advised to follow-up with [facility] for day 4 and 7 labs, appointments made ?-Patient may return to MAU as needed or if her condition were to change or worsen ? ?Wende Mott, CNM ?10/12/2021, 1:27 PM ? ?

## 2021-10-13 ENCOUNTER — Other Ambulatory Visit (HOSPITAL_COMMUNITY): Payer: Medicaid Other

## 2021-10-15 ENCOUNTER — Ambulatory Visit (INDEPENDENT_AMBULATORY_CARE_PROVIDER_SITE_OTHER): Payer: Medicaid Other

## 2021-10-15 VITALS — BP 124/69 | HR 75 | Temp 98.7°F | Wt 166.0 lb

## 2021-10-15 DIAGNOSIS — O00101 Right tubal pregnancy without intrauterine pregnancy: Secondary | ICD-10-CM

## 2021-10-15 LAB — BETA HCG QUANT (REF LAB): hCG Quant: 263 m[IU]/mL

## 2021-10-15 NOTE — Progress Notes (Signed)
Beta HCG Follow-up Visit ? ?Ana Horton presents to [facility] for follow-up beta HCG lab. She was seen in MAU 10/12/21 for second dose of methotrexate for right tubal ectopic pregnancy. Patient reports moderate to light red vaginal bleeding since administration of second dose of MTX.  Discussed with patient that we are following beta HCG levels today. Valid contact number for patient confirmed. I will call the patient with results. Patient desires pregnancy, would like to wait 6 months to try for pregnancy again. Would like to discuss with OB/GYN. Patient to schedule follow up appt during check out. ? ?Beta HCG results: ?10/12/21 DAY 1 569  ?10/15/21 DAY 4 263  ? ?Results and patient history reviewed with Merian Capron, MD, who states patient should return 10/18/21 for DAY 7 beta HCG lab. Patient called and informed of plan for follow-up. Return precautions reviewed. ? ?Marjo Bicker ?10/15/2021 10:11 AM  ?

## 2021-10-18 ENCOUNTER — Ambulatory Visit: Payer: Medicaid Other

## 2021-10-25 ENCOUNTER — Inpatient Hospital Stay (HOSPITAL_COMMUNITY)
Admission: AD | Admit: 2021-10-25 | Discharge: 2021-10-25 | Disposition: A | Discharge status: Home / Self Care | Payer: Medicaid Other | Attending: Obstetrics & Gynecology | Admitting: Obstetrics & Gynecology

## 2021-10-25 DIAGNOSIS — O009 Unspecified ectopic pregnancy without intrauterine pregnancy: Secondary | ICD-10-CM

## 2021-10-25 DIAGNOSIS — Z3200 Encounter for pregnancy test, result unknown: Secondary | ICD-10-CM | POA: Insufficient documentation

## 2021-10-25 LAB — COMPREHENSIVE METABOLIC PANEL
ALT: 8 U/L (ref 0–44)
AST: 15 U/L (ref 15–41)
Albumin: 4 g/dL (ref 3.5–5.0)
Alkaline Phosphatase: 63 U/L (ref 38–126)
Anion gap: 6 (ref 5–15)
BUN: 8 mg/dL (ref 6–20)
CO2: 27 mmol/L (ref 22–32)
Calcium: 9.5 mg/dL (ref 8.9–10.3)
Chloride: 107 mmol/L (ref 98–111)
Creatinine, Ser: 0.86 mg/dL (ref 0.44–1.00)
GFR, Estimated: >60 mL/min (ref >60)
Glucose, Bld: 86 mg/dL (ref 70–99)
Potassium: 4.4 mmol/L (ref 3.5–5.1)
Sodium: 140 mmol/L (ref 135–145)
Total Bilirubin: 0.5 mg/dL (ref 0.3–1.2)
Total Protein: 7.2 g/dL (ref 6.5–8.1)

## 2021-10-25 LAB — HCG, QUANTITATIVE, PREGNANCY: hCG, Beta Chain, Quant, S: 59 m[IU]/mL — ABNORMAL HIGH (ref <5)

## 2021-10-25 LAB — CBC
HCT: 35.6 % — ABNORMAL LOW (ref 36.0–46.0)
Hemoglobin: 11.8 g/dL — ABNORMAL LOW (ref 12.0–15.0)
MCH: 30.6 pg (ref 26.0–34.0)
MCHC: 33.1 g/dL (ref 30.0–36.0)
MCV: 92.5 fL (ref 80.0–100.0)
Platelets: 299 10*3/uL (ref 150–400)
RBC: 3.85 MIL/uL — ABNORMAL LOW (ref 3.87–5.11)
RDW: 14.2 % (ref 11.5–15.5)
WBC: 7.5 10*3/uL (ref 4.0–10.5)
nRBC: 0 % (ref 0.0–0.2)

## 2021-10-25 NOTE — MAU Note (Signed)
Pt was treated with MTX on 4/9 and 4/15. Took upt today and it was positive. Reports bleeding last wk was dark but this wk brighter red. Using one pad a day. Mild cramping on occ at a 1 on pain scale. Wants to be sure this is not a new pregnancy as she lost her left tube from a previous ectopic.  ?

## 2021-10-25 NOTE — Progress Notes (Signed)
Gerrit Heck CNM in earlier to discuss test results that are back and d/c plan. Written and verbal d/c instructions given and understanding voiced.  ?

## 2021-10-25 NOTE — MAU Note (Signed)
Jessica Emly CNM in Triage to see pt and discuss plan of care.  

## 2021-10-25 NOTE — MAU Provider Note (Signed)
Event Date/Time  ? First Provider Initiated Contact with Patient 10/25/21 2138   ?  ? ?S ?Ms. Ana Horton is a 21 y.o. G2P0010 patient who presents to MAU today with complaint of "wanting to make sure everything is okay." She reports she took a UPT today and was concerned that it was positive. She endorses sexually activity and no condom usage.  She endorses vaginal bleeding and denies pain or bleeding. She states last week she was having dark brown bleeding, but now it is lighter. She expresses desire for pregnancy.  ? ?O ?BP (!) 109/57   Pulse 65   Temp 97.9 ?F (36.6 ?C)   Resp 17   Ht 5\' 4"  (1.626 m)   Wt 74.4 kg   LMP 08/14/2021   SpO2 100%   BMI 28.15 kg/m?  ?Physical Exam ?Vitals reviewed.  ?Constitutional:   ?   Appearance: Normal appearance.  ?HENT:  ?   Head: Normocephalic and atraumatic.  ?Eyes:  ?   Conjunctiva/sclera: Conjunctivae normal.  ?Cardiovascular:  ?   Rate and Rhythm: Normal rate.  ?Pulmonary:  ?   Effort: Pulmonary effort is normal. No respiratory distress.  ?Musculoskeletal:  ?   Cervical back: Normal range of motion.  ?Neurological:  ?   Mental Status: She is alert and oriented to person, place, and time.  ?Psychiatric:     ?   Mood and Affect: Mood normal.     ?   Behavior: Behavior normal.  ? ? ?A ?Medical screening exam complete ?S/P MTX Dosing ? ?P ?-Informed that she should abstain from sexual activity or utilize condoms until hCG levels return to non-pregnant state. ?-Educated on hCG down trends and informed can take up to 6 weeks to return to non-pregnant state. ?-Discussed follow up in office in 1 week. ?-Message sent to be sent to [facility] for scheduling as appropriate. ?-Labs ordered and pending. ?-Patient requests discharge. ?-Verbalizes understanding that if necessary she may be required to return to MAU for further treatment/evaluation.  ?-Informed that if normal, labs will be reviewed via mychart.  ?-Patient agreeable.  ?-Discharge from MAU in stable condition ? ?[facility], CNM ?10/25/2021 9:38 PM  ? ?

## 2021-10-28 ENCOUNTER — Telehealth: Payer: Self-pay | Admitting: Family Medicine

## 2021-10-28 NOTE — Telephone Encounter (Signed)
Called patient to inform of lab appointment, there was no answer to the phone call so a voicemail was left with the call back number for the office and a letter was mailed.  ?

## 2021-11-04 ENCOUNTER — Other Ambulatory Visit: Payer: Medicaid Other

## 2021-11-04 ENCOUNTER — Other Ambulatory Visit: Payer: Self-pay | Admitting: *Deleted

## 2021-11-04 DIAGNOSIS — O00101 Right tubal pregnancy without intrauterine pregnancy: Secondary | ICD-10-CM

## 2021-11-05 ENCOUNTER — Other Ambulatory Visit: Payer: Self-pay

## 2021-11-05 ENCOUNTER — Inpatient Hospital Stay (HOSPITAL_COMMUNITY)
Admission: AD | Admit: 2021-11-05 | Discharge: 2021-11-05 | Disposition: A | Discharge status: Home / Self Care | Payer: Medicaid Other | Attending: Obstetrics and Gynecology | Admitting: Obstetrics and Gynecology

## 2021-11-05 DIAGNOSIS — O00109 Unspecified tubal pregnancy without intrauterine pregnancy: Secondary | ICD-10-CM | POA: Diagnosis not present

## 2021-11-05 DIAGNOSIS — Z3A00 Weeks of gestation of pregnancy not specified: Secondary | ICD-10-CM | POA: Diagnosis not present

## 2021-11-05 DIAGNOSIS — Z30012 Encounter for prescription of emergency contraception: Secondary | ICD-10-CM | POA: Diagnosis not present

## 2021-11-05 DIAGNOSIS — O009 Unspecified ectopic pregnancy without intrauterine pregnancy: Secondary | ICD-10-CM | POA: Diagnosis present

## 2021-11-05 DIAGNOSIS — Z30016 Encounter for initial prescription of transdermal patch hormonal contraceptive device: Secondary | ICD-10-CM

## 2021-11-05 LAB — HCG, QUANTITATIVE, PREGNANCY: hCG, Beta Chain, Quant, S: 3 m[IU]/mL (ref <5)

## 2021-11-05 MED ORDER — ELLA 30 MG PO TABS
1.0000 | ORAL_TABLET | Freq: Once | ORAL | 1 refills | Status: AC
Start: 2021-11-05 — End: 2021-11-05

## 2021-11-05 MED ORDER — NORELGESTROMIN-ETH ESTRADIOL 150-35 MCG/24HR TD PTWK: 1.0000 | MEDICATED_PATCH | TRANSDERMAL | 12 refills | Status: DC

## 2021-11-05 NOTE — MAU Note (Signed)
Ana Horton is a 21 y.o. at [redacted]w[redacted]d here in MAU reporting: she's here for f/u HCG level.  Reports was supposed to have done yesterday.  Also reports had MTX for ectopic pregnancy in April 2023 ? ?Onset of complaint: April 2023 ?Pain score: 0 ?Vitals:  ? 11/05/21 1756  ?BP: 102/66  ?Pulse: 73  ?Resp: 19  ?Temp: 98.8 ?F (37.1 ?C)  ?SpO2: 99%  ?   ?FHT:N/A ?Lab orders placed from triage:    ?

## 2021-11-05 NOTE — Discharge Instructions (Addendum)
You came to the MAU for a repeat lab for your previous ectopic pregnancy and your lab for your pregnancy test level is below 5 which means you do not have to get any more repeat hormone levels. ? ?You also asked for emergency contraception. We sent some to your pharmacy. You should take it as soon as possible. If you miss a period please take a pregnancy test. ? ?You also wanted to have your birth control sent as you are trying to avoid another pregnancy at this  time. We sent a prescription for the birth control patch to your pharmacy. Please keep your doctors appointment on 5/15. ?

## 2021-11-05 NOTE — MAU Provider Note (Signed)
?History  ?  ? ?CSN: [id number] ? ?Arrival date and time: 11/05/21 1631 ? ? None  ?  ? ?Chief Complaint  ?Patient presents with  ? HCG level  ? ?HPI ?21 yo G2P0020 with known ectopic pregnancy s/p 2 doses of MTX presents for hcg follow up.  Currently asymptomatic.  Denies vaginal bleeding and abdominal pain. Patient also with one prior ectopic pregnancy with hx of salpingectomy. ? ?HCG trends: ?4/9 HCG 407 MTX  ?4/12 536 ?4/15 569 MTX ?4/18 263 ?4/28 59 ?5/9 3  (today) ? ?Patient also requests emergency contraception for unprotected intercourse yesterday. Would like to start birth control patch. Reports was sent patch previously but did not pick it up. ? ? ? ? ?OB History   ? ? Gravida  ?2  ? Para  ?0  ? Term  ?0  ? Preterm  ?0  ? AB  ?1  ? Living  ?0  ?  ? ? SAB  ?0  ? IAB  ?0  ? Ectopic  ?1  ? Multiple  ?0  ? Live Births  ?0  ?   ?  ?  ? ? ?Past Medical History:  ?Diagnosis Date  ? Medical history non-contributory   ? ? ?Past Surgical History:  ?Procedure Laterality Date  ? LAPAROSCOPIC UNILATERAL SALPINGECTOMY Left 08/10/2021  ? Procedure: LAPAROSCOPIC LEFT SALPINGECTOMY WITH REMOVAL OF ECTOPIC PREGNANCY;  Surgeon: Warden Fillers, MD;  [facility];  Service: Gynecology;  Laterality: Left;  ? ? ?Family History  ?Problem Relation Age of Onset  ? Hypertension Father   ? Thyroid disease Mother   ? ? ?Social History  ? ?Tobacco Use  ? Smoking status: Some Days  ? Smokeless tobacco: Never  ? Tobacco comments:  ?  black and mild  ?Vaping Use  ? Vaping Use: Some days  ?Substance Use Topics  ? Alcohol use: No  ? Drug use: No  ? ? ?Allergies: No Known Allergies ? ?Medications Prior to Admission  ?Medication Sig Dispense Refill Last Dose  ? sertraline (ZOLOFT) 50 MG tablet Take 1 tablet (50 mg total) by mouth daily. (Patient not taking: Reported on 08/28/2021) 30 tablet 5   ? ? ?Review of Systems  ?Cardiovascular:  Negative for chest pain.  ?Gastrointestinal:  Negative for abdominal pain.  ?Endocrine: Negative for  polyuria.  ?Musculoskeletal:  Negative for back pain.  ?All other systems reviewed and are negative. ?Physical Exam  ? ?Blood pressure 102/66, pulse 73, temperature 98.8 ?F (37.1 ?C), temperature source Oral, resp. rate 19, height 5\' 4"  (1.626 m), weight 74.5 kg, last menstrual period 08/14/2021, SpO2 99 %, unknown if currently breastfeeding. ? ?Physical Exam ?Vitals and nursing note reviewed.  ?HENT:  ?   Head: Normocephalic.  ?Cardiovascular:  ?   Rate and Rhythm: Normal rate.  ?   Pulses: Normal pulses.  ?Pulmonary:  ?   Effort: Pulmonary effort is normal.  ?Abdominal:  ?   General: Abdomen is flat. There is no distension.  ?Skin: ?   General: Skin is warm.  ?   Capillary Refill: Capillary refill takes less than 2 seconds.  ?Neurological:  ?   General: No focal deficit present.  ?   Mental Status: She is alert.  ? ? ?MAU Course  ?Procedures ?None today ? ?MDM ?Moderate ?Ectopic follow-up ?4/15 dose 2 of MTX, missed hcg visit in office. Hcg appropriately downtrended to 3. Can stop following. ? ? ?Assessment and Plan  ?Ectopic follow up ?4/15 dose 2 of  MTX, missed hcg visit in office. Hcg appropriately downtrended to 3. Can stop following. ? ?2. Unprotected intercourse ?Desires emergency contraception. Candelaria Celeste to pharmacy with refills. Discussed in detail that since intercourse was yesterday it is possible it would not work so if she misses a period needs to take a pregnancy test.  ? ?3. Contraception counseling ?Patient reports she would really like to start more regular contraception as she does not want to become pregnant especially given her hx of multiple ectopics. Patient has appt in office on 5/15 but given risks and desire to not get pregnant sent patch to patient's pharmacy (method per her request after counseling regarding LARCs, depo, and pills). Discussed using condoms as additional method. ?Renard Matter, MD, MPH ?OB Fellow, Faculty Practice ? ?

## 2021-11-06 ENCOUNTER — Other Ambulatory Visit: Payer: Medicaid Other

## 2021-11-11 ENCOUNTER — Ambulatory Visit: Payer: Medicaid Other | Admitting: Obstetrics & Gynecology

## 2021-11-21 ENCOUNTER — Encounter: Payer: Self-pay | Admitting: Obstetrics & Gynecology

## 2021-12-27 ENCOUNTER — Encounter: Payer: Self-pay | Admitting: Obstetrics and Gynecology

## 2021-12-27 ENCOUNTER — Other Ambulatory Visit: Payer: Self-pay

## 2021-12-27 ENCOUNTER — Ambulatory Visit (INDEPENDENT_AMBULATORY_CARE_PROVIDER_SITE_OTHER): Payer: Medicaid Other | Admitting: Obstetrics and Gynecology

## 2021-12-27 VITALS — BP 110/63 | HR 62 | Wt 153.8 lb

## 2021-12-27 DIAGNOSIS — N736 Female pelvic peritoneal adhesions (postinfective): Secondary | ICD-10-CM

## 2021-12-27 DIAGNOSIS — F418 Other specified anxiety disorders: Secondary | ICD-10-CM

## 2021-12-27 DIAGNOSIS — Z8719 Personal history of other diseases of the digestive system: Secondary | ICD-10-CM | POA: Diagnosis not present

## 2021-12-27 DIAGNOSIS — Z8659 Personal history of other mental and behavioral disorders: Secondary | ICD-10-CM

## 2021-12-27 DIAGNOSIS — Z3202 Encounter for pregnancy test, result negative: Secondary | ICD-10-CM | POA: Diagnosis not present

## 2021-12-27 DIAGNOSIS — Z8759 Personal history of other complications of pregnancy, childbirth and the puerperium: Secondary | ICD-10-CM

## 2021-12-27 DIAGNOSIS — Z5941 Food insecurity: Secondary | ICD-10-CM | POA: Diagnosis not present

## 2021-12-27 HISTORY — DX: Female pelvic peritoneal adhesions (postinfective): N73.6

## 2021-12-27 HISTORY — DX: Personal history of other diseases of the digestive system: Z87.19

## 2021-12-27 LAB — POCT PREGNANCY, URINE: Preg Test, Ur: NEGATIVE

## 2021-12-27 NOTE — Progress Notes (Unsigned)
Obstetrics and Gynecology Visit Return Patient Evaluation  Appointment Date: 12/27/2021  Primary Care Provider: Pcp, No  OBGYN Clinic: [facility]  Chief Complaint: ectopic follow up  History of Present Illness:  Ana Horton is a 21 y.o. [phone] with above CC. Patient diagnosed on 4/9 with another ectopic pregnancy. This time it was on the right side and she was given methotrexate x 2 and this treated it. She had a February 2023 left salpingectomy 5wk5d ectopic with a heartbeat. Operative findings showed, "large left sided ectopic taking up the majority of the tube.  Numerous areas of thin adhesions and Fitz-Hugh Curtis adhesions to the liver."  Interval History: She was given an Rx for the patch in early may 2023 but she has not started it and she states she is having sex every day. She was on nexplanon until late 2022 but she states it "messes with my hormones" and she used the patch in the past but it kept peeling off.   Patient also wondering if she has herpes b/c her partner has lesions; the patient is asymptomatic.   Review of Systems:  as noted in the History of Present Illness.  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 Current Outpatient Medications  Medication Sig Dispense Refill   norelgestromin-ethinyl estradiol [employer]) 150-35 MCG/24HR transdermal patch Place 1 patch onto the skin once a week. (Patient not taking: Reported on 12/27/2021) 3 patch 12   sertraline (ZOLOFT) 50 MG tablet Take 1 tablet (50 mg total) by mouth daily. (Patient not taking: Reported on 08/28/2021) 30 tablet 5   No current facility-administered medications  for this visit.    Allergies: has No Known Allergies.  Physical Exam:  BP 110/63   Pulse 62   Wt 153 lb 12.8 oz (69.8 kg)   LMP 08/14/2021   Breastfeeding Unknown   BMI 26.40 kg/m  Body mass index is 26.4 kg/m. General appearance: Well nourished, well developed female in no acute distress.  Neuro/Psych:  Normal mood and affect.     12/27/2021    9:59 AM  Depression screen PHQ 2/9  Decreased Interest 1  Down, Depressed, Hopeless 2  PHQ - 2 Score 3  Altered sleeping 3  Tired, decreased energy 3  Change in appetite 3  Feeling bad or failure about yourself  3  Trouble concentrating 3  Moving slowly or fidgety/restless 3  Suicidal thoughts 1  PHQ-9 Score 22   Labs: UPT negative  Assessment: pt stable  Plan:  1. Food insecurity - AMBULATORY REFERRAL TO [facility]  2. H/O Fitz-Hugh-Curtis syndrome  3. Pelvic adhesive disease  4. History of ectopic pregnancy See below  5. History of suicidal ideation Long d/w the patient re: everything that's going on. I asked her if she has a h/o STDs and she did say she had chlamydia in the past so this was updated on her history; she is alone today but she says that she's engaged.   I told her that I strongly do not recommend pregnancy at least for six months, and ideally until she is in a stable commited relationship. This is to help her emotionally heal but also to ensure there are no lingering effects  of the methotrexate which could cause birth defects. Patient unsure of what her periods are like because she hasn't been off birth control and not pregnant for a prolonged period of time. I told her I recommend abstinence and check a UPT (or can have [place] do it at the office in 2 weeks) and if still negative then she is not pregnant. She declines anything for birth control currently.   See PHQ-9. No current SI thoughts or plan. BH in office not available today, but she does contract for safety, but I told her I recommend she go  to the [facility] urgent care across the street after her visit today; directions and instructions given to her.   I told her that if she ever got pregnant in the future that she needs to let us know or come to the hospital asap for close follow up to ensure that she does not have another ectopic  - Ambulatory referral to Integrated Behavioral Health  6. Depression with anxiety  7. GYN If no s/s for HSV, no further testing is recommended for her.    RTC: 1wk for [facility] visit with Asher Muir. 2-29m for annual  Total time taking care of the patient was 40 minutes, with greater than 50% of the time spent in face to face interaction with the patient.  Cornelia Copa MD Attending Center for [facility])

## 2021-12-30 NOTE — BH Specialist Note (Deleted)
Integrated Behavioral Health via Telemedicine Visit  12/30/2021 Karem Farha [id number]  Number of Integrated Behavioral Health Clinician visits: No data recorded Session Start time: No data recorded  Session End time: No data recorded Total time in minutes: No data recorded  Referring Provider: *** Patient/Family location: *** [facility] Provider location: *** All persons participating in visit: *** Types of Service: {CHL AMB TYPE OF SERVICE:[id number]}  I connected with Ruben Gottron and/or Nayab Fancher's {family members:20773} via  Telephone or Engineer, civil (consulting)  (Video is Surveyor, mining) and verified that I am speaking with the correct person using two identifiers. Discussed confidentiality: {YES/NO:21197}  I discussed the limitations of telemedicine and the availability of in person appointments.  Discussed there is a possibility of technology failure and discussed alternative modes of communication if that failure occurs.  I discussed that engaging in this telemedicine visit, they consent to the provision of behavioral healthcare and the services will be billed under their insurance.  Patient and/or legal guardian expressed understanding and consented to Telemedicine visit: {YES/NO:21197}  Presenting Concerns: Patient and/or family reports the following symptoms/concerns: *** Duration of problem: ***; Severity of problem: {Mild/Moderate/Severe:20260}  Patient and/or Family's Strengths/Protective Factors: {CHL AMB BH PROTECTIVE [phone]}  Goals Addressed: Patient will:  Reduce symptoms of: {IBH Symptoms:21014056}   Increase knowledge and/or ability of: {IBH Patient Tools:21014057}   Demonstrate ability to: {IBH Goals:21014053}  Progress towards Goals: {CHL AMB BH PROGRESS TOWARDS GOALS:[phone]}  Interventions: Interventions utilized:  {IBH Interventions:21014054} Standardized Assessments completed: {IBH Screening  Tools:21014051}  Patient and/or Family Response: ***  Assessment: Patient currently experiencing ***.   Patient may benefit from ***.  Plan: Follow up with behavioral health clinician on : *** Behavioral recommendations: *** Referral(s): {IBH Referrals:21014055}  I discussed the assessment and treatment plan with the patient and/or parent/guardian. They were provided an opportunity to ask questions and all were answered. They agreed with the plan and demonstrated an understanding of the instructions.   They were advised to call back or seek an in-person evaluation if the symptoms worsen or if the condition fails to improve as anticipated.  Valetta Close Jeoffrey Eleazer, LCSW

## 2022-01-02 ENCOUNTER — Telehealth: Payer: Self-pay | Admitting: Clinical

## 2022-01-02 NOTE — Telephone Encounter (Signed)
Attempt to contact regarding rescheduling appointment for July 10 at 9:15am; Left HIPPA-compliant message to call back Asher Muir from Center for [employer] at [facility] at  [phone] [facility] office); left MyChart message for pt.

## 2022-01-08 NOTE — BH Specialist Note (Signed)
Pt did not arrive to video visit and did not answer the phone; Left HIPPA-compliant message to call back Kameela Leipold from [facility] at  [phone] (Abbie Jablon's office).  ?; left MyChart message for patient.  ? ?

## 2022-01-14 ENCOUNTER — Ambulatory Visit: Payer: Self-pay | Admitting: Clinical

## 2022-01-14 DIAGNOSIS — Z91199 Patient's noncompliance with other medical treatment and regimen due to unspecified reason: Secondary | ICD-10-CM

## 2022-01-17 ENCOUNTER — Telehealth: Payer: Self-pay | Admitting: Licensed Clinical Social Worker

## 2022-01-17 NOTE — Telephone Encounter (Signed)
Called pt regarding ibh referral left message requesting callback  

## 2022-01-24 ENCOUNTER — Telehealth: Payer: Self-pay

## 2022-01-24 NOTE — Telephone Encounter (Signed)
Spoke with Ms. Adamek today regarding referral to [facility].  Patient stated that she is still interested in shopping at the market and that she will stop by during the week of January 27, 2022. Provided information for hours of operation and informed patient that she will need to check in at registration before coming to the [facility]. Patient stated understanding. No additional needs at this time.   Closing referral pending any other needs of patient.    Danelle Berry [facility], [facility] [address]. [place], [place] [address] Ph: [phone] Email: [email or web address]

## 2022-02-04 ENCOUNTER — Inpatient Hospital Stay (HOSPITAL_COMMUNITY)
Admission: AD | Admit: 2022-02-04 | Discharge: 2022-02-04 | Disposition: A | Discharge status: Home / Self Care | Payer: Medicaid Other | Attending: Obstetrics and Gynecology | Admitting: Obstetrics and Gynecology

## 2022-02-04 DIAGNOSIS — R102 Pelvic and perineal pain: Secondary | ICD-10-CM

## 2022-02-04 DIAGNOSIS — N939 Abnormal uterine and vaginal bleeding, unspecified: Secondary | ICD-10-CM | POA: Diagnosis not present

## 2022-02-04 DIAGNOSIS — Z3202 Encounter for pregnancy test, result negative: Secondary | ICD-10-CM | POA: Insufficient documentation

## 2022-02-04 LAB — POCT PREGNANCY, URINE: Preg Test, Ur: NEGATIVE

## 2022-02-04 NOTE — MAU Provider Note (Signed)
None     S Ms. Ana Horton is a 21 y.o. G2P0010 patient who presents to MAU today with complaint of bleeding and cramping    Her pregnancy test is NEGATIVE.   O BP (!) 98/57   Pulse 73   Temp 98.2 F (36.8 C)   Resp 17   Ht 5\' 4"  (1.626 m)   Wt 70.8 kg   LMP 01/29/2022   SpO2 99%   BMI 26.78 kg/m  Physical Exam Constitutional:      General: She is not in acute distress.    Appearance: She is not ill-appearing or toxic-appearing.  Cardiovascular:     Rate and Rhythm: Normal rate.  Pulmonary:     Effort: Pulmonary effort is normal.  Neurological:     Mental Status: She is alert.     A Medical screening exam complete   P Discharge from MAU in stable condition Patient given the option of transfer to [facility] for further evaluation or seek care in outpatient facility of choice  List of options for follow-up given  Warning signs for worsening condition that would warrant emergency follow-up discussed Patient may return to MAU as needed   [facility] 02/04/2022 9:58 PM

## 2022-02-04 NOTE — MAU Note (Signed)
.  Ana Horton is a 21 y.o. at Unknown here in MAU reporting vaginal bleeding since 8/2 and stopped Sunday but continues to spot. Has had vomiting for several wks. and abdominal cramping. Has not done home upt.  LMP: 8/2 Onset of complaint:   Pain score: 7 Vitals:   02/04/22 2123 02/04/22 2125  BP:  (!) 98/57  Pulse: 73   Resp: 17   Temp: 98.2 F (36.8 C)   SpO2: 99%      FHT:n/a Lab orders placed from triage:   upt

## 2022-02-04 NOTE — MAU Note (Signed)
Wynelle Bourgeois CNM in Triage to see pt and discuss plan of care. Pt then d/c home from Triage by CNM

## 2022-03-10 ENCOUNTER — Ambulatory Visit: Payer: Medicaid Other | Admitting: Obstetrics and Gynecology

## 2023-01-04 ENCOUNTER — Inpatient Hospital Stay (HOSPITAL_COMMUNITY)
Admission: AD | Admit: 2023-01-04 | Discharge: 2023-01-05 | Disposition: A | Discharge status: Home / Self Care | Payer: Medicaid Other | Attending: Obstetrics and Gynecology | Admitting: Obstetrics and Gynecology

## 2023-01-04 ENCOUNTER — Inpatient Hospital Stay (HOSPITAL_COMMUNITY): Payer: Medicaid Other

## 2023-01-04 DIAGNOSIS — F419 Anxiety disorder, unspecified: Secondary | ICD-10-CM | POA: Insufficient documentation

## 2023-01-04 DIAGNOSIS — Z3A01 Less than 8 weeks gestation of pregnancy: Secondary | ICD-10-CM | POA: Diagnosis present

## 2023-01-04 DIAGNOSIS — Z3490 Encounter for supervision of normal pregnancy, unspecified, unspecified trimester: Secondary | ICD-10-CM

## 2023-01-04 DIAGNOSIS — O99341 Other mental disorders complicating pregnancy, first trimester: Secondary | ICD-10-CM | POA: Diagnosis not present

## 2023-01-04 DIAGNOSIS — O09291 Supervision of pregnancy with other poor reproductive or obstetric history, first trimester: Secondary | ICD-10-CM | POA: Insufficient documentation

## 2023-01-04 DIAGNOSIS — Z8759 Personal history of other complications of pregnancy, childbirth and the puerperium: Secondary | ICD-10-CM

## 2023-01-04 LAB — POCT PREGNANCY, URINE: Preg Test, Ur: POSITIVE — AB

## 2023-01-04 NOTE — MAU Note (Signed)
.  Ana Horton is a 22 y.o. at Unknown here in MAU reporting: took two home preg tests that were POS today. Pt states has a hx of ectopic pregnancy x2 and a tubal removal of the left fallopian tube. PT states she was told the last time to come in for evaluation if she was to get pregnant again.  Pt denies pain or VB.  LMP: ukn Onset of complaint: n/a Pain score: 0/10 Vitals:   01/04/23 2327  Pulse: 73  Resp: 18  SpO2: 100%      Lab orders placed from triage:

## 2023-01-04 NOTE — MAU Provider Note (Signed)
History     CSN: [id number]  Arrival date and time: 01/04/23 2308   Event Date/Time   First Provider Initiated Contact with Patient 01/04/23 2335      Chief Complaint  Patient presents with   Possible Pregnancy   Ana Horton is a 22 y.o. G2P0010 at Unknown who receives care at [facility].  She presents today for pregnancy confirmation.  Patient reports concern with positive UPT tonight with known history of ectopic pregnancy x 2 and h/o salpingectomy. Patient denies pain or discomfort.  She denies vaginal bleeding or discharge.  She is unsure of her LMP, but reports regular cycle.    OB History     Gravida  2   Para  0   Term  0   Preterm  0   AB  1   Living  0      SAB  0   IAB  0   Ectopic  1   Multiple  0   Live Births  0           Past Medical History:  Diagnosis Date   Chlamydia    Medical history non-contributory     Past Surgical History:  Procedure Laterality Date   LAPAROSCOPIC UNILATERAL SALPINGECTOMY Left 08/10/2021   Procedure: LAPAROSCOPIC LEFT SALPINGECTOMY WITH REMOVAL OF ECTOPIC PREGNANCY;  Surgeon: Warden Fillers, MD;  Location: [facility] OR;  Service: Gynecology;  Laterality: Left;    Family History  Problem Relation Age of Onset   Hypertension Father    Thyroid disease Mother     Social History   Tobacco Use   Smoking status: Some Days   Smokeless tobacco: Never   Tobacco comments:    black and mild  Vaping Use   Vaping Use: Some days  Substance Use Topics   Alcohol use: No   Drug use: No    Allergies: No Known Allergies  Medications Prior to Admission  Medication Sig Dispense Refill Last Dose   norelgestromin-ethinyl estradiol Burr Medico) 150-35 MCG/24HR transdermal patch Place 1 patch onto the skin once a week. (Patient not taking: Reported on 12/27/2021) 3 patch 12    sertraline (ZOLOFT) 50 MG tablet Take 1 tablet (50 mg total) by mouth daily. (Patient not taking: Reported on 08/28/2021) 30 tablet 5     Review of  Systems  Gastrointestinal:  Negative for abdominal pain, constipation, diarrhea, nausea and vomiting.  Genitourinary:  Negative for difficulty urinating, dysuria, vaginal bleeding and vaginal discharge.  Musculoskeletal:  Negative for back pain.   Physical Exam   Blood pressure 120/60, pulse 73, resp. rate 18, height 5\' 3"  (1.6 m), weight 69.9 kg, SpO2 100 %, unknown if currently breastfeeding.  Physical Exam Vitals reviewed.  Constitutional:      General: She is not in acute distress.    Appearance: Normal appearance.  HENT:     Head: Normocephalic and atraumatic.  Eyes:     Conjunctiva/sclera: Conjunctivae normal.  Cardiovascular:     Rate and Rhythm: Normal rate.  Pulmonary:     Effort: Pulmonary effort is normal. No respiratory distress.  Musculoskeletal:        General: Normal range of motion.     Cervical back: Normal range of motion.  Skin:    General: Skin is warm and dry.  Neurological:     Mental Status: She is alert and oriented to person, place, and time.  Psychiatric:        Mood and Affect: Mood normal.  Behavior: Behavior normal.     MAU Course  Procedures Results for orders placed or performed during the hospital encounter of 01/04/23 (from the past 24 hour(s))  Pregnancy, urine POC     Status: Abnormal   Collection Time: 01/04/23 11:36 PM  Result Value Ref Range   Preg Test, Ur POSITIVE (A) NEGATIVE  Comprehensive metabolic panel     Status: Abnormal   Collection Time: 01/04/23 11:54 PM  Result Value Ref Range   Sodium 135 135 - 145 mmol/L   Potassium 3.5 3.5 - 5.1 mmol/L   Chloride 107 98 - 111 mmol/L   CO2 21 (L) 22 - 32 mmol/L   Glucose, Bld 87 70 - 99 mg/dL   BUN 12 6 - 20 mg/dL   Creatinine, Ser 4.69 0.44 - 1.00 mg/dL   Calcium 8.8 (L) 8.9 - 10.3 mg/dL   Total Protein 5.9 (L) 6.5 - 8.1 g/dL   Albumin 3.4 (L) 3.5 - 5.0 g/dL   AST 14 (L) 15 - 41 U/L   ALT 11 0 - 44 U/L   Alkaline Phosphatase 42 38 - 126 U/L   Total Bilirubin 0.5 0.3  - 1.2 mg/dL   GFR, Estimated >62 >95 mL/min   Anion gap 7 5 - 15  CBC     Status: Abnormal   Collection Time: 01/04/23 11:54 PM  Result Value Ref Range   WBC 9.5 4.0 - 10.5 K/uL   RBC 3.74 (L) 3.87 - 5.11 MIL/uL   Hemoglobin 11.4 (L) 12.0 - 15.0 g/dL   HCT 28.4 (L) 13.2 - 44.0 %   MCV 90.1 80.0 - 100.0 fL   MCH 30.5 26.0 - 34.0 pg   MCHC 33.8 30.0 - 36.0 g/dL   RDW 10.2 72.5 - 36.6 %   Platelets 249 150 - 400 K/uL   nRBC 0.0 0.0 - 0.2 %   US OB LESS THAN 14 WEEKS WITH OB TRANSVAGINAL  Result Date: 01/05/2023 CLINICAL DATA:  Positive home pregnancy test, history of ectopic pregnancy. EXAM: OBSTETRIC <14 WK [place] AND TRANSVAGINAL OB US TECHNIQUE: Both transabdominal and transvaginal ultrasound examinations were performed for complete evaluation of the gestation as well as the maternal uterus, adnexal regions, and pelvic cul-de-sac. Transvaginal technique was performed to assess early pregnancy. COMPARISON:  10/06/2021. FINDINGS: Intrauterine gestational sac: Single Yolk sac:  No Embryo:  No Cardiac Activity: No Heart Rate: None MSD: 6.2 mm   5 w   2 d Subchorionic hemorrhage:  None visualized. Maternal uterus/adnexae: A corpus luteal cyst is noted in the right ovary. The left ovary is within normal limits. A small to moderate amount of free fluid is present in the cul-de-sac. IMPRESSION: 1. Single intrauterine gestational sac with estimated gestational age of [redacted] weeks 2 days. Probable early intrauterine gestational sac, but no yolk sac, fetal pole, or cardiac activity yet visualized. The possibility of ectopic pregnancy can not be completely excluded. Recommend follow-up quantitative B-HCG levels and follow-up US in 14 days to assess viability. This recommendation follows SRU consensus guidelines: Diagnostic Criteria for Nonviable Pregnancy Early in the First Trimester. Malva Limes Med 2013; 440:3474-25. 2. Small to moderate amount of free fluid in the pelvis. Electronically Signed   By: Thornell Sartorius M.D.    On: 01/05/2023 00:29    MDM Labs: UA, UPT, CBC, hCG, CMP Ultrasound Coordination of Follow Up Assessment and Plan  22 year old +UPT H/O Ectopic Pregnancy  -Informed that work up will be performed d/t history and to  ease patient anxiety.  -Patient verbalizes understanding. -Labs ordered. -Send for [place].  Cherre Robins 01/04/2023, 11:36 PM   Reassessment (12:58 AM)  -hCG pending. -[place] returns as above. -Provider to bedside and informs patient of findings. -Reassured that GS noted in uterus. -Patient expresses relief with findings. -Discussed need for follow up, for viability, in 1-2 weeks with primary office. -Patient agreeable.  -[place] ordered. -Strict precautions given. -Patient questions regarding dating addressed. Informed that dating will be provided once fetus noted. -Encouraged to call primary office or return to MAU if symptoms worsen or with the onset of new symptoms. -Discharged to home in stable condition.  Cherre Robins MSN, CNM Advanced Practice Provider, [facility] [employer]

## 2023-01-05 ENCOUNTER — Encounter (HOSPITAL_COMMUNITY): Payer: Self-pay | Admitting: Obstetrics and Gynecology

## 2023-01-05 DIAGNOSIS — Z3A01 Less than 8 weeks gestation of pregnancy: Secondary | ICD-10-CM

## 2023-01-05 DIAGNOSIS — Z3490 Encounter for supervision of normal pregnancy, unspecified, unspecified trimester: Secondary | ICD-10-CM

## 2023-01-05 LAB — CBC
HCT: 33.7 % — ABNORMAL LOW (ref 36.0–46.0)
Hemoglobin: 11.4 g/dL — ABNORMAL LOW (ref 12.0–15.0)
MCH: 30.5 pg (ref 26.0–34.0)
MCHC: 33.8 g/dL (ref 30.0–36.0)
MCV: 90.1 fL (ref 80.0–100.0)
Platelets: 249 10*3/uL (ref 150–400)
RBC: 3.74 MIL/uL — ABNORMAL LOW (ref 3.87–5.11)
RDW: 13.6 % (ref 11.5–15.5)
WBC: 9.5 10*3/uL (ref 4.0–10.5)
nRBC: 0 % (ref 0.0–0.2)

## 2023-01-05 LAB — COMPREHENSIVE METABOLIC PANEL
ALT: 11 U/L (ref 0–44)
AST: 14 U/L — ABNORMAL LOW (ref 15–41)
Albumin: 3.4 g/dL — ABNORMAL LOW (ref 3.5–5.0)
Alkaline Phosphatase: 42 U/L (ref 38–126)
Anion gap: 7 (ref 5–15)
BUN: 12 mg/dL (ref 6–20)
CO2: 21 mmol/L — ABNORMAL LOW (ref 22–32)
Calcium: 8.8 mg/dL — ABNORMAL LOW (ref 8.9–10.3)
Chloride: 107 mmol/L (ref 98–111)
Creatinine, Ser: 0.9 mg/dL (ref 0.44–1.00)
GFR, Estimated: >60 mL/min (ref >60)
Glucose, Bld: 87 mg/dL (ref 70–99)
Potassium: 3.5 mmol/L (ref 3.5–5.1)
Sodium: 135 mmol/L (ref 135–145)
Total Bilirubin: 0.5 mg/dL (ref 0.3–1.2)
Total Protein: 5.9 g/dL — ABNORMAL LOW (ref 6.5–8.1)

## 2023-01-05 LAB — HCG, QUANTITATIVE, PREGNANCY: hCG, Beta Chain, Quant, S: 5701 m[IU]/mL — ABNORMAL HIGH (ref <5)

## 2023-01-06 ENCOUNTER — Other Ambulatory Visit: Payer: Self-pay | Admitting: *Deleted

## 2023-01-06 DIAGNOSIS — Z8759 Personal history of other complications of pregnancy, childbirth and the puerperium: Secondary | ICD-10-CM

## 2023-01-06 DIAGNOSIS — Z3490 Encounter for supervision of normal pregnancy, unspecified, unspecified trimester: Secondary | ICD-10-CM

## 2023-01-10 ENCOUNTER — Encounter (HOSPITAL_COMMUNITY): Payer: Self-pay | Admitting: Obstetrics & Gynecology

## 2023-01-10 ENCOUNTER — Other Ambulatory Visit: Payer: Self-pay

## 2023-01-10 ENCOUNTER — Inpatient Hospital Stay (HOSPITAL_COMMUNITY)
Admission: AD | Admit: 2023-01-10 | Discharge: 2023-01-10 | Disposition: A | Discharge status: Home / Self Care | Payer: Medicaid Other | Attending: Obstetrics & Gynecology | Admitting: Obstetrics & Gynecology

## 2023-01-10 DIAGNOSIS — R102 Pelvic and perineal pain: Secondary | ICD-10-CM | POA: Insufficient documentation

## 2023-01-10 DIAGNOSIS — O09291 Supervision of pregnancy with other poor reproductive or obstetric history, first trimester: Secondary | ICD-10-CM | POA: Diagnosis not present

## 2023-01-10 DIAGNOSIS — O26891 Other specified pregnancy related conditions, first trimester: Secondary | ICD-10-CM | POA: Diagnosis not present

## 2023-01-10 DIAGNOSIS — Z3A01 Less than 8 weeks gestation of pregnancy: Secondary | ICD-10-CM | POA: Insufficient documentation

## 2023-01-10 LAB — URINALYSIS, ROUTINE W REFLEX MICROSCOPIC
Bilirubin Urine: NEGATIVE
Glucose, UA: NEGATIVE mg/dL
Hgb urine dipstick: NEGATIVE
Ketones, ur: NEGATIVE mg/dL
Leukocytes,Ua: NEGATIVE
Nitrite: NEGATIVE
Protein, ur: NEGATIVE mg/dL
Specific Gravity, Urine: 1.027 (ref 1.005–1.030)
pH: 5 (ref 5.0–8.0)

## 2023-01-10 NOTE — MAU Note (Signed)
Ana Horton is a 23 y.o. at Unknown here in MAU reporting: she began having mild cramping today while at work.  Denies VB. LMP: Unsure in June Onset of complaint: today Pain score: 6 Vitals:   01/10/23 1223  BP: (!) 114/57  Pulse: 68  Resp: 17  Temp: 98 F (36.7 C)  SpO2: 100%     FHT:NA Lab orders placed from triage:   UA

## 2023-01-10 NOTE — MAU Provider Note (Signed)
History     CSN: [id number]  Arrival date and time: 01/10/23 1212   Event Date/Time   First Provider Initiated Contact with Patient 01/10/23 1302      Chief Complaint  Patient presents with   Cramping   Ana Horton is a 22 y.o. G2P0010 at about [redacted] weeks gestation who presents for pelvic cramping at work today that improved after eating. She denies vaginal bleeding. She is eating, drinking, and using the bathroom normally.   OB History     Gravida  2   Para  0   Term  0   Preterm  0   AB  1   Living  0      SAB  0   IAB  0   Ectopic  1   Multiple  0   Live Births  0           Past Medical History:  Diagnosis Date   Chlamydia    Medical history non-contributory     Past Surgical History:  Procedure Laterality Date   LAPAROSCOPIC UNILATERAL SALPINGECTOMY Left 08/10/2021   Procedure: LAPAROSCOPIC LEFT SALPINGECTOMY WITH REMOVAL OF ECTOPIC PREGNANCY;  Surgeon: Ana Horton;  Location: [facility] OR;  Service: Gynecology;  Laterality: Left;    Family History  Problem Relation Age of Onset   Hypertension Father    Thyroid disease Mother     Social History   Tobacco Use   Smoking status: Some Days   Smokeless tobacco: Never   Tobacco comments:    black and mild  Vaping Use   Vaping status: Some Days  Substance Use Topics   Alcohol use: No   Drug use: No    Allergies: No Known Allergies  No medications prior to admission.    Review of Systems  Constitutional:  Negative for activity change and appetite change.  Gastrointestinal:  Negative for constipation.  Genitourinary:  Positive for pelvic pain. Negative for difficulty urinating and vaginal bleeding.  Psychiatric/Behavioral:  Negative for sleep disturbance.    Physical Exam   Blood pressure (!) 114/57, pulse 68, temperature 98 F (36.7 C), temperature source Oral, resp. rate 17, height 5\' 4"  (1.626 m), weight 70 kg, last menstrual period 01/29/2022, SpO2 100%, unknown if  currently breastfeeding.  Physical Exam Vitals reviewed.  Constitutional:      General: She is not in acute distress.    Appearance: She is not ill-appearing, toxic-appearing or diaphoretic.  Eyes:     Extraocular Movements: Extraocular movements intact.     Conjunctiva/sclera: Conjunctivae normal.  Cardiovascular:     Rate and Rhythm: Normal rate.  Pulmonary:     Effort: Pulmonary effort is normal.  Neurological:     Mental Status: She is alert and oriented to person, place, and time. Mental status is at baseline.  Psychiatric:        Mood and Affect: Mood normal.        Behavior: Behavior normal.     MAU Course  Procedures  MDM OBSTETRIC <14 WK [place] AND TRANSVAGINAL OB US   TECHNIQUE: Both transabdominal and transvaginal ultrasound examinations were performed for complete evaluation of the gestation as well as the maternal uterus, adnexal regions, and pelvic cul-de-sac. Transvaginal technique was performed to assess early pregnancy.   COMPARISON:  10/06/2021.   FINDINGS: Intrauterine gestational sac: Single   Yolk sac:  No   Embryo:  No   Cardiac Activity: No   Heart Rate: None   MSD: 6.2 mm  5 w   2 d   Subchorionic hemorrhage:  None visualized.   Maternal uterus/adnexae: A corpus luteal cyst is noted in the right ovary. The left ovary is within normal limits. A small to moderate amount of free fluid is present in the cul-de-sac.   IMPRESSION: 1. Single intrauterine gestational sac with estimated gestational age of [redacted] weeks 2 days. Probable early intrauterine gestational sac, but no yolk sac, fetal pole, or cardiac activity yet visualized. The possibility of ectopic pregnancy can not be completely excluded. Recommend follow-up quantitative B-HCG levels and follow-up US in 14 days to assess viability. This recommendation follows SRU consensus guidelines: Diagnostic Criteria for Nonviable Pregnancy Early in the First Trimester. Ana Horton 2013;  884:1660-63. 2. Small to moderate amount of free fluid in the pelvis.     Results for orders placed or performed during the hospital encounter of 01/10/23 (from the past 72 hour(s))  Urinalysis, Routine w reflex microscopic -Urine, Clean Catch     Status: Abnormal   Collection Time: 01/10/23 12:32 Horton  Result Value Ref Range   Color, Urine YELLOW YELLOW   APPearance HAZY (A) CLEAR   Specific Gravity, Urine 1.027 1.005 - 1.030   pH 5.0 5.0 - 8.0   Glucose, UA NEGATIVE NEGATIVE mg/dL   Hgb urine dipstick NEGATIVE NEGATIVE   Bilirubin Urine NEGATIVE NEGATIVE   Ketones, ur NEGATIVE NEGATIVE mg/dL   Protein, ur NEGATIVE NEGATIVE mg/dL   Nitrite NEGATIVE NEGATIVE   Leukocytes,Ua NEGATIVE NEGATIVE    Comment: Performed at [facility], [address], [place], [place] [address]     Assessment and Plan  1. Pelvic cramping Ana Horton is a 22 y.o. G2P0010 at [redacted] weeks gestation w/ hx of ectopic resulting in salpingectomy who present for pelvic cramping. She was seen in the MAU 7/7 and IUP confirmed with questionable viability. She has follow up scheduled as below. Offered reassurance that pelvic cramping can be normal in pregnancy. Encouraged her to follow up if bleeding with cramping returns. She voiced understanding.   Future Appointments  Date Time Provider Department Center  01/19/2023  9:00 AM [facility] 1 [facility]  01/19/2023  4:10 Horton Ana Pall, Horton [facility] None      Ana Horton

## 2023-01-19 ENCOUNTER — Telehealth: Payer: Medicaid Other | Admitting: Obstetrics and Gynecology

## 2023-01-19 ENCOUNTER — Ambulatory Visit (HOSPITAL_BASED_OUTPATIENT_CLINIC_OR_DEPARTMENT_OTHER)
Admission: RE | Admit: 2023-01-19 | Discharge: 2023-01-19 | Disposition: A | Discharge status: Home / Self Care | Payer: Medicaid Other | Source: Ambulatory Visit | Attending: Obstetrics and Gynecology | Admitting: Obstetrics and Gynecology

## 2023-01-19 DIAGNOSIS — O020 Blighted ovum and nonhydatidiform mole: Secondary | ICD-10-CM | POA: Diagnosis not present

## 2023-01-19 DIAGNOSIS — Z3A01 Less than 8 weeks gestation of pregnancy: Secondary | ICD-10-CM | POA: Insufficient documentation

## 2023-01-19 DIAGNOSIS — Z8759 Personal history of other complications of pregnancy, childbirth and the puerperium: Secondary | ICD-10-CM

## 2023-01-19 DIAGNOSIS — Z3490 Encounter for supervision of normal pregnancy, unspecified, unspecified trimester: Secondary | ICD-10-CM

## 2023-01-19 DIAGNOSIS — Z3689 Encounter for other specified antenatal screening: Secondary | ICD-10-CM | POA: Insufficient documentation

## 2023-01-20 ENCOUNTER — Encounter: Payer: Self-pay | Admitting: Obstetrics and Gynecology

## 2023-04-27 ENCOUNTER — Encounter: Payer: Medicaid Other | Admitting: Obstetrics and Gynecology

## 2023-07-01 NOTE — L&D Delivery Note (Signed)
 Delivery Note Ana Horton is a 23 y.o. G4P0030 at [redacted]w[redacted]d admitted for elective IOL.   GBS Status:  Negative/-- (07/23 1248)  Labor course: Initial SVE: 2/50/-3. Augmentation with: Pitocin  and Cytotec . She then progressed to complete.  ROM: 13h 68m with clear fluid  Birth: After a 1 hour 2nd stage, she delivered a Live born female  Birth Weight:   APGAR: ,   Newborn Delivery   Birth date/time: 02/[birth year]/2024 00:33:00 Delivery type: Vaginal, Spontaneous        Delivered via spontaneous vaginal delivery (Presentation: OA ). Nuchal cord present: No. . Shoulders and body delivered in usual fashion. Infant placed directly on mom's abdomen for bonding/skin-to-skin, baby dried and stimulated. Cord clamped x 2 after 1 minute and cut by FOB.  Cord blood collected. Placenta delivered-Spontaneous with 3 vessels. 20u Pitocin  in 500cc LR given as a bolus prior to delivery of placenta.  Fundus firm with massage. Placenta inspected and appears to be intact with a 3 VC.  Sponge and instrument count were correct x2.  Intrapartum complications:  None Anesthesia:  epidural Lacerations:  none Suture Repair: n/a EBL (mL):69.00    Mom to postpartum.  Baby to Couplet care / Skin to Skin. Placenta to Home with patient   Plans to Breast and bottlefeed Contraception: condoms Circumcision: N/A  Note sent to [facility]: [facility] for pp visit.  Delivery Report:   Review the Delivery Report for details.     Signed: Cathlean Ely, DNP,CNM 02/16/2024, 1:49 AM

## 2023-07-02 ENCOUNTER — Inpatient Hospital Stay (HOSPITAL_COMMUNITY): Payer: Medicaid Other

## 2023-07-02 ENCOUNTER — Inpatient Hospital Stay (HOSPITAL_COMMUNITY)
Admission: AD | Admit: 2023-07-02 | Discharge: 2023-07-02 | Disposition: A | Discharge status: Home / Self Care | Payer: Medicaid Other | Attending: Obstetrics & Gynecology | Admitting: Obstetrics & Gynecology

## 2023-07-02 ENCOUNTER — Other Ambulatory Visit: Payer: Self-pay

## 2023-07-02 ENCOUNTER — Encounter (HOSPITAL_COMMUNITY): Payer: Self-pay | Admitting: Obstetrics & Gynecology

## 2023-07-02 DIAGNOSIS — O26891 Other specified pregnancy related conditions, first trimester: Secondary | ICD-10-CM | POA: Diagnosis present

## 2023-07-02 DIAGNOSIS — R1030 Lower abdominal pain, unspecified: Secondary | ICD-10-CM | POA: Diagnosis not present

## 2023-07-02 DIAGNOSIS — Z3A01 Less than 8 weeks gestation of pregnancy: Secondary | ICD-10-CM | POA: Diagnosis not present

## 2023-07-02 DIAGNOSIS — Z3491 Encounter for supervision of normal pregnancy, unspecified, first trimester: Secondary | ICD-10-CM

## 2023-07-02 DIAGNOSIS — O09291 Supervision of pregnancy with other poor reproductive or obstetric history, first trimester: Secondary | ICD-10-CM | POA: Insufficient documentation

## 2023-07-02 DIAGNOSIS — R109 Unspecified abdominal pain: Secondary | ICD-10-CM

## 2023-07-02 LAB — URINALYSIS, ROUTINE W REFLEX MICROSCOPIC
Bilirubin Urine: NEGATIVE
Glucose, UA: NEGATIVE mg/dL
Hgb urine dipstick: NEGATIVE
Ketones, ur: 5 mg/dL — AB
Leukocytes,Ua: NEGATIVE
Nitrite: NEGATIVE
Protein, ur: NEGATIVE mg/dL
Specific Gravity, Urine: 1.027 (ref 1.005–1.030)
pH: 5 (ref 5.0–8.0)

## 2023-07-02 LAB — CBC
HCT: 35.4 % — ABNORMAL LOW (ref 36.0–46.0)
Hemoglobin: 12.1 g/dL (ref 12.0–15.0)
MCH: 30.5 pg (ref 26.0–34.0)
MCHC: 34.2 g/dL (ref 30.0–36.0)
MCV: 89.2 fL (ref 80.0–100.0)
Platelets: 265 10*3/uL (ref 150–400)
RBC: 3.97 MIL/uL (ref 3.87–5.11)
RDW: 13.3 % (ref 11.5–15.5)
WBC: 8.8 10*3/uL (ref 4.0–10.5)
nRBC: 0 % (ref 0.0–0.2)

## 2023-07-02 LAB — HCG, QUANTITATIVE, PREGNANCY: hCG, Beta Chain, Quant, S: 68871 m[IU]/mL — ABNORMAL HIGH (ref <5)

## 2023-07-02 LAB — WET PREP, GENITAL
Clue Cells Wet Prep HPF POC: NONE SEEN
Sperm: NONE SEEN
Trich, Wet Prep: NONE SEEN
WBC, Wet Prep HPF POC: <10 (ref <10)
Yeast Wet Prep HPF POC: NONE SEEN

## 2023-07-02 LAB — POCT PREGNANCY, URINE: Preg Test, Ur: POSITIVE — AB

## 2023-07-02 NOTE — MAU Note (Signed)
 Ana Horton is a 23 y.o. at Unknown here in MAU reporting: she is having intermittent abdominal cramping that began at the end of November.  Denies VB.  States +HPT.  LMP: 05/14/2023 Onset of complaint: ongoing Pain score: 6 Vitals:   07/02/23 1740  BP: 107/63  Pulse: 67  Resp: 18  Temp: 98.2 F (36.8 C)  SpO2: 100%     FHT:NA Lab orders placed from triage: UPT

## 2023-07-02 NOTE — MAU Provider Note (Addendum)
 History     CSN: [id number]  Arrival date and time: 07/02/23 1707   Event Date/Time   First Provider Initiated Contact with Patient 07/02/23 1848      Chief Complaint  Patient presents with   Cramping   HPI Ana Horton is a 23 y.o. G4P0030 at [redacted]w[redacted]d who presents with abdominal cramping. Symptoms started the beginning of December. Reports a positive pregnancy test in mid December. Bilateral lower abdominal pains that are intermittent. Has also noticed increase in loose stools (which happened in previous pregnancies too), nausea, & vaginal irritation. Denies vaginal bleeding, fever/chills, dysuria, constipation, vomiting. Hx of ectopic pregnancy x 2.   OB History     Gravida  4   Para  0   Term  0   Preterm  0   AB  3   Living  0      SAB  1   IAB  0   Ectopic  2   Multiple  0   Live Births  0           Past Medical History:  Diagnosis Date   Chlamydia     Past Surgical History:  Procedure Laterality Date   LAPAROSCOPIC UNILATERAL SALPINGECTOMY Left 08/10/2021   Procedure: LAPAROSCOPIC LEFT SALPINGECTOMY WITH REMOVAL OF ECTOPIC PREGNANCY;  Surgeon: Zina Jerilynn LABOR, MD;  Location: [facility] OR;  Service: Gynecology;  Laterality: Left;    Family History  Problem Relation Age of Onset   Hypertension Father    Thyroid disease Mother     Social History   Tobacco Use   Smoking status: Some Days   Smokeless tobacco: Never   Tobacco comments:    black and mild  Vaping Use   Vaping status: Some Days  Substance Use Topics   Alcohol use: No   Drug use: No    Allergies: No Known Allergies  No medications prior to admission.    Review of Systems  All other systems reviewed and are negative.  Physical Exam   Blood pressure 107/63, pulse 67, temperature 98.2 F (36.8 C), temperature source Oral, resp. rate 18, height 5' 3 (1.6 m), weight 69.3 kg, last menstrual period 05/14/2023, SpO2 100%, unknown if currently breastfeeding.  Physical  Exam Vitals and nursing note reviewed.  Constitutional:      General: She is not in acute distress.    Appearance: She is well-developed. She is not ill-appearing.  HENT:     Head: Normocephalic and atraumatic.  Eyes:     General: No scleral icterus.       Right eye: No discharge.        Left eye: No discharge.     Conjunctiva/sclera: Conjunctivae normal.  Pulmonary:     Effort: Pulmonary effort is normal. No respiratory distress.  Abdominal:     General: Abdomen is flat. There is no distension.     Palpations: Abdomen is soft.     Tenderness: There is no abdominal tenderness. There is no rebound.  Neurological:     General: No focal deficit present.     Mental Status: She is alert.  Psychiatric:        Mood and Affect: Mood normal.        Behavior: Behavior normal.     MAU Course  Procedures Results for orders placed or performed during the hospital encounter of 07/02/23 (from the past 24 hours)  Urinalysis, Routine w reflex microscopic -Urine, Clean Catch     Status: Abnormal   Collection Time:  07/02/23  6:08 PM  Result Value Ref Range   Color, Urine YELLOW YELLOW   APPearance CLEAR CLEAR   Specific Gravity, Urine 1.027 1.005 - 1.030   pH 5.0 5.0 - 8.0   Glucose, UA NEGATIVE NEGATIVE mg/dL   Hgb urine dipstick NEGATIVE NEGATIVE   Bilirubin Urine NEGATIVE NEGATIVE   Ketones, ur 5 (A) NEGATIVE mg/dL   Protein, ur NEGATIVE NEGATIVE mg/dL   Nitrite NEGATIVE NEGATIVE   Leukocytes,Ua NEGATIVE NEGATIVE  Pregnancy, urine POC     Status: Abnormal   Collection Time: 07/02/23  6:08 PM  Result Value Ref Range   Preg Test, Ur POSITIVE (A) NEGATIVE  Wet prep, genital     Status: None   Collection Time: 07/02/23  6:20 PM   Specimen: PATH Cytology Cervicovaginal Ancillary Only  Result Value Ref Range   Yeast Wet Prep HPF POC NONE SEEN NONE SEEN   Trich, Wet Prep NONE SEEN NONE SEEN   Clue Cells Wet Prep HPF POC NONE SEEN NONE SEEN   WBC, Wet Prep HPF POC <10 <10   Sperm NONE  SEEN   CBC     Status: Abnormal   Collection Time: 07/02/23  6:41 PM  Result Value Ref Range   WBC 8.8 4.0 - 10.5 K/uL   RBC 3.97 3.87 - 5.11 MIL/uL   Hemoglobin 12.1 12.0 - 15.0 g/dL   HCT 64.5 (L) 63.9 - 53.9 %   MCV 89.2 80.0 - 100.0 fL   MCH 30.5 26.0 - 34.0 pg   MCHC 34.2 30.0 - 36.0 g/dL   RDW 86.6 88.4 - 84.4 %   Platelets 265 150 - 400 K/uL   nRBC 0.0 0.0 - 0.2 %  hCG, quantitative, pregnancy     Status: Abnormal   Collection Time: 07/02/23  6:41 PM  Result Value Ref Range   hCG, Beta Chain, Quant, S 68,871 (H) <5 mIU/mL   US  OB Comp Less 14 Wks Result Date: 07/02/2023 CLINICAL DATA:  Abdominal pain, history of ectopic pregnancy EXAM: OBSTETRIC <14 WK ULTRASOUND TECHNIQUE: Transabdominal ultrasound was performed for evaluation of the gestation as well as the maternal uterus and adnexal regions. COMPARISON:  01/19/2023 FINDINGS: Intrauterine gestational sac: Single Yolk sac:  Visualized. Embryo:  Visualized. Cardiac Activity: Visualized. Heart Rate: 128 bpm CRL:   9.1 mm   6 w 6 d                  US  EDC: 02/19/2024 Subchorionic hemorrhage:  None visualized. Maternal uterus/adnexae: Corpus luteal cyst identified within the right ovary. Left ovary is not well seen. No free fluid. IMPRESSION: 1. Single live intrauterine pregnancy as above, estimated age 53 weeks and 6 days. Electronically Signed   By: Ozell Daring M.D.   On: 07/02/2023 21:31    MDM +UPT UA, wet prep, GC/chlamydia, CBC, ABO/Rh, quant hCG, and US  today to rule out ectopic pregnancy which can be life threatening.   Ultrasound pending Care turned over to Dr. Nicholaus Rocky [place] 07/02/2023 8:55 PM   Assessment and Plan   1. Normal IUP (intrauterine pregnancy) on prenatal ultrasound, first trimester (Primary) - Discharge patient  2. [redacted] weeks gestation of pregnancy - Discharge patient  3. Abdominal cramping - Discharge patient  Patient with IUP. No evidence of an ectopic at this time. Sent message to [facility] to get  patient scheduled for initial prenatal visit. Has started PNV. Provided with list of pregnancy safe medications. Miscarriage precautions given as she is early first trimester.  Ana CHRISTELLA Moats, MD

## 2023-07-02 NOTE — Discharge Instructions (Signed)
 [facility] [address], [place], [place] [address] Phone: [phone]   Safe Medications in Pregnancy   Acne: Benzoyl Peroxide Salicylic Acid  Backache/Headache: Tylenol: 2 regular strength every 4 hours OR              2 Extra strength every 6 hours  Colds/Coughs/Allergies: Benadryl (alcohol free) 25 mg every 6 hours as needed Breath right strips Claritin Cepacol throat lozenges Chloraseptic throat spray Cold-Eeze- up to three times per day Cough drops, alcohol free Flonase (by prescription only) Guaifenesin Mucinex Robitussin DM (plain only, alcohol free) Saline nasal spray/drops Sudafed (pseudoephedrine) & Actifed ** use only after [redacted] weeks gestation and if you do not have high blood pressure Tylenol Vicks Vaporub Zinc lozenges Zyrtec   Constipation: Colace Ducolax suppositories Fleet enema Glycerin suppositories Metamucil Milk of magnesia Miralax Senokot Smooth move tea  Diarrhea: Kaopectate Imodium A-D  *NO pepto Bismol  Hemorrhoids: Anusol Anusol HC Preparation H Tucks  Indigestion: Tums Maalox Mylanta Zantac  Pepcid  Insomnia: Benadryl (alcohol free) 25mg  every 6 hours as needed Tylenol PM Unisom, no Gelcaps  Leg Cramps: Tums MagGel  Nausea/Vomiting:  Bonine Dramamine Emetrol Ginger extract Sea bands Meclizine  Nausea medication to take during pregnancy:  Unisom (doxylamine succinate 25 mg tablets) Take one tablet daily at bedtime. If symptoms are not adequately controlled, the dose can be increased to a maximum recommended dose of two tablets daily (1/2 tablet in the morning, 1/2 tablet mid-afternoon and one at bedtime). Vitamin B6 100mg  tablets. Take one tablet twice a day (up to 200 mg per day).  Skin Rashes: Aveeno products Benadryl cream or 25mg  every 6 hours as needed Calamine Lotion 1% cortisone cream  Yeast infection: Gyne-lotrimin 7 Monistat 7   **If taking multiple medications, please check labels  to avoid duplicating the same active ingredients **take medication as directed on the label ** Do not exceed 3000 mg of tylenol in 24 hours **Do not take medications that contain aspirin or ibuprofen

## 2023-07-03 LAB — GC/CHLAMYDIA PROBE AMP (~~LOC~~) NOT AT ARMC
Chlamydia: NEGATIVE
Comment: NEGATIVE
Comment: NORMAL
Neisseria Gonorrhea: NEGATIVE

## 2023-07-28 ENCOUNTER — Telehealth: Payer: Self-pay

## 2023-07-28 DIAGNOSIS — O0990 Supervision of high risk pregnancy, unspecified, unspecified trimester: Secondary | ICD-10-CM | POA: Insufficient documentation

## 2023-07-28 DIAGNOSIS — Z3490 Encounter for supervision of normal pregnancy, unspecified, unspecified trimester: Secondary | ICD-10-CM | POA: Insufficient documentation

## 2023-07-28 DIAGNOSIS — O0991 Supervision of high risk pregnancy, unspecified, first trimester: Secondary | ICD-10-CM

## 2023-07-28 DIAGNOSIS — Z3A10 10 weeks gestation of pregnancy: Secondary | ICD-10-CM

## 2023-07-28 NOTE — Patient Instructions (Signed)
Safe Medications in Pregnancy   Acne:  Benzoyl Peroxide  Salicylic Acid   Backache/Headache:  Tylenol: 2 regular strength every 4 hours OR               2 Extra strength every 6 hours   Colds/Coughs/Allergies:  Benadryl (alcohol free) 25 mg every 6 hours as needed  Breath right strips  Claritin  Cepacol throat lozenges  Chloraseptic throat spray  Cold-Eeze- up to three times per day  Cough drops, alcohol free  Flonase (by prescription only)  Guaifenesin  Mucinex  Robitussin DM (plain only, alcohol free)  Saline nasal spray/drops  Sudafed (pseudoephedrine) & Actifed * use only after [redacted] weeks gestation and if you do not have high blood pressure  Tylenol  Vicks Vaporub  Zinc lozenges  Zyrtec   Constipation:  Colace  Ducolax suppositories  Fleet enema  Glycerin suppositories  Metamucil  Milk of magnesia  Miralax  Senokot  Smooth move tea   Diarrhea:  Kaopectate  Imodium A-D   *NO pepto Bismol   Hemorrhoids:  Anusol  Anusol HC  Preparation H  Tucks   Indigestion:  Tums  Maalox  Mylanta  Zantac  Pepcid   Insomnia:  Benadryl (alcohol free) 25mg  every 6 hours as needed  Tylenol PM  Unisom, no Gelcaps   Leg Cramps:  Tums  MagGel   Nausea/Vomiting:  Bonine  Dramamine  Emetrol  Ginger extract  Sea bands  Meclizine  Nausea medication to take during pregnancy:  Unisom (doxylamine succinate 25 mg tablets) Take one tablet daily at bedtime. If symptoms are not adequately controlled, the dose can be increased to a maximum recommended dose of two tablets daily (1/2 tablet in the morning, 1/2 tablet mid-afternoon and one at bedtime).  Vitamin B6 100mg  tablets. Take one tablet twice a day (up to 200 mg per day).   Skin Rashes:  Aveeno products  Benadryl cream or 25mg  every 6 hours as needed  Calamine Lotion  1% cortisone cream   Yeast infection:  Gyne-lotrimin 7  Monistat 7    **If taking multiple medications, please check labels to avoid  duplicating the same active ingredients  **take medication as directed on the label  ** Do not exceed 4000 mg of tylenol in 24 hours  **Do not take medications that contain aspirin or ibuprofen          [facility] Providers  Central/[place]outheast [place] ([address]) [facility] Manson Passey, MD; Deirdre Priest, MD; Lum Babe, MD; Leveda Anna, MD; McDiarmid, MD; Jerene Bears, MD [address] [place], [place] [address] [phone] Mon-Fri 8:30-12:30, 1:30-5:00  Providers come to see babies during newborn hospitalization Only accepting infants of Mother's who are seen at [facility] or have siblings seen at   [facility] Medicaid - Yes; Tricare - Yes   [facility] [place], MD [address], [place], [place] [address] [phone] Mon, Tue, Thur, Fri 8:30-5:00, Wed 10:00-7:00 (closed 1-2pm daily for lunch) [facility] residents with no insurance.  [place] [employer] only with Medicaid/insurance; Tricare - no  [facility]) - [facility], MD; Manson Passey, MD; Ave Filter, MD; Luna Fuse, MD; Kennedy Bucker, MD; Florestine Avers, MD; Melchor Amour, MD; Yetta Barre,  MD; Konrad Dolores, MD; Kathlene November, MD; Jenne Campus, MD; Wynetta Emery, MD; Duffy Rhody, MD; Gerre Couch, NP [address] [place], [place], [place] [address] [phone] Mon, Tue, Thur, Fri 8:30-5:30, Wed 9:30-5:30, Sat 8:30-12:30 Only accepting infants of first-time parents or siblings of current patients [facility]  discharge coordinator will make follow-up appointment Medicaid - yes; Tricare - yes  [place] ([address]) [facility], MD; Earlene Plater, MD; Jamesetta Orleans, MD; Alvera Novel, MD; Rana Snare, MD; [facility], MD; [place], MD; Hosie Poisson, MD; Mayford Knife, MD [address], [place], [place] [address] [phone] Mon-Fri 8:30-5:00, closed for lunch 12:30-1:30; Sat-Sun 10:00-1:00 Accepting Newborns with commercial insurance only, must call prior to  delivery to be accepted into  practice.  Medicaid - no, Tricare - yes   [facility] [address] [place], [place] [address] [phone] or [phone] Mon to Fri 8am to 10pm, Sat 8am to 1pm (virtual only on weekends) Only accepts Medicaid Healthy Blue pts  [facility] ([facility]) - Pediatrics at Elige Radon, MD; Sabino Dick, MD; Quitman Livings, MD; Betha Loa, NP; Claretha Cooper, MD; Lelon Perla, MD [address] [place] [place], [place] [address] [phone] Mon-Fri 8:30-5:30 Medicaid - yes, Tricare - yes  [place] [phone]) [facility] Mowers, MD [address], [place], [place] [address] [phone] Iona Hansen, Wed Fri 8:30-5:00, Sat 8:30-12:00, Closed Thursdays Accepting siblings of established patients and first time mom's if you call prenatally Medicaid- yes; Tricare - yes  [facility] Lutricia Feil, [place]; Tracie Harrier, MD; Rusty Aus; [place], PA; Wynelle Link, MD; Azucena Cecil, MD;  [address] [place], [place] [address] [phone] Mon-Fri 8:30-5:00, closed for lunch 1-2 Only accepting newborns of established patients Medicaid- no; Tricare - yes  [facility] [phone]) [facility] Morene Crocker, MD; [address], [place], [place] [address] [phone] Mon-Fri 8:00-5:00 Medicaid - No; Tricare - Yes  [facility], [place]; [place], [place] [address] [place], [place] [address] [phone] Mon-Fri 8:00-5:00 Medicaid - No, Tricare - Yes  [facility] Cardell Peach, MD; Nash Dimmer, MD; [place], [place] [address] [place], [place] [address] [phone]  Mon-Fri 8:00-5:00 Medicaid - No; Tricare - Yes  [facility] [address] [place], [place] [address] [phone] Mon-Fri 8:30-5:00 (lunch 12:00-1:00) Medicaid -Yes; Tricare - Yes  [facility] [place], MD [address] [place],  [place], [place] [address] [phone] Mon-Fri 8:00-5:00 Seeing newborns of current patients only. No new patients Medicaid - No, Tricare - yes  Nature conservation officer at [place] [address], MD [address] [place], [place] [address] [phone] Mon-Fri 8:00-5:00 Medicaid -yes as secondary coverage only; Tricare - yes  [facility] [place], [place]; [place], [place]; Avis Epley, MD; Vonna Kotyk, MD; Clance Boll, MD; [place], [place]; [place], NP; Vaughan Basta, MD; [place], MD [address] [place], [place] [address] [phone] Mon-Fri 8:30-5:00, Sat 9:00-11:00 Accepts commercial insurance ONLY. Offers free prenatal information sessions for families. Medicaid - No, Tricare - Call first  [facility], MD; [place], [place]; [place], [place]; [place], [place] [address] [place] [address] [phone] Mon-Fri 7:30-5:30 Medicaid - Yes; Ailene Rud yes  [place] [phone] & [phone])  [facility], MD [address] [place], [place] [address] [phone] Mon-Thur 8:00-6:00, closed for lunch 12-2, closed Fridays Medicaid - yes; Tricare - no  [facility] Dareen Piano, NP; Cyndia Bent, MD; [place], [place]; [place], [place] [address], [place], [place] [address] [phone] Mon-Fri 7:30-4:30 Medicaid - yes, Tricare - yes  [facility]  Juanito Doom, MD; Janene Harvey, NP; Vonita Moss, MD; Donn Pierini, NP [address], [place], [place] [address] [phone] Mon-Fri 8:30-5:00, closed for lunch 1-2, Sat 8:30-12:00 - sick visits only Providers come to see babies at  [facility] Only accepting newborns of siblings and first time parents ONLY if who have met with office prior to delivery Medicaid -Yes; Tricare - yes  [facility] - [place], [place]; Spero Geralds, NP; Earlene Plater, MD; Lucretia Roers, MD:  [address], [place], [place] [address] [phone] Mon- Fri 8:00-5:00, Sat 9:00-12:00 - sick  visits only Accepting siblings of established patients and first time mom/baby Medicaid - Yes; Tricare - yes Patients must have vaccinations (baby vaccines)  [place] [phone] & [phone])  Adult nurse HealthCare at [facility] [address] [place], [place] [address] [phone] Mon-Fri 8:00-5:00 Medicaid - no; Tricare - yes  [facility] [place], MD; [place], [place]; [place], [place] [address], [place], [place] [address] [phone] Mon-Fri 8:00-5:00 Medicaid- yes; Tricare - yes  [facility] - Ardeen Jourdain, MD; Yetta Barre, NP; [place], [place] [address] [place], [place], [place] [address] [phone] Mon-Fri 8:00-5:00 Medicaid - Yes; Tricare - yes  [address] [place] [phone])  [facility] [place], [place]; [place], [place]; Eddie Candle, MD; Normand Sloop, MD; [place], NP; [place], DO; [place], [place]; Constance Goltz, MD; Ruthann Cancer, MD; Vear Clock, MD; [place], [place]; [place], [place]; [place], [place] [address] [facility] [address], [place], [place] [address] [phone] Mon-Fri 8:30-5:00, Sat 9:00-12:00 - sick only Please register online [email or web address] then schedule online or call office Medicaid-Yes; Tricare -yes  [facility] - [place]  [place], MD; Romualdo Bolk, MD; [place], MD; [place]. [place], NP; [place], [place]; Antonietta Barcelona, MD; Mayford Knife, NP; Shelva Majestic, MD [address], [place], [place] [address] [phone] Mon-Fri 8:00-5:30, Sat&Sun by appointment (phones open at 8:30) Medicaid - Yes; Tricare - yes  [place] [phone] & [phone]) [facility] Mariel Aloe; [place], MD; Roger Shelter, MD; Arvilla Market, NP; [place], DO [address], [place], [place] [address] [phone] M-F 8:00 - 5:15, Sat/Sun 9-12 sick visits only Medicaid - No; Tricare - yes  [facility]  [place], PA-C; [place], PA-C; [place], DO; [place], PA-C; [place], PA-C; Roselyn Bering, MD [address], [place], [place] [address] [phone] Mon-Thur 8:00-7:00, Fri 8:00-5:00 Accepting Medicaid for 13 and under only   [facility] - Family Medicine at [place] [facility] [place]) [place], [place]; List, FNP; Berneda Rose, MD; Luther Redo, PA-C; Lavonia Drafts, MD; Kellie Simmering, FNP; Genevie Cheshire, FNP; Evaristo Bury, MD; Berneda Rose, MD [phone] [address] [place], [place] [address] [phone] Mon-Fri 8:30-5:30 Medicaid - Yes; Tricare - yes  [facility] Pediatrics - [address]  [place], [place]; Whitney Post, MD; Hennie Duos, MD; Wynne Dust, MD; [place], NP [address], [place], [place] [address] [phone] Mon-Thur 8:00-5:30, Fri 8:00-5:00, Sat 9:00-12:00 Medicaid - yes, Tricare - yes  [place] [phone])  [facility], [place]; Lenise Arena, MD; [place], [place] [address], [place], [place] [address] [phone] Mon-Fri 8:00-5:00, closed for lunch 12-1 Medicaid - No; Tricare - yes  Nature conservation officer at [facility], MD [address] [place], [place] [address] [phone] Mon-Fri 8:00-5:00 Medicaid - No; Tricare - yes  [facility], MD; Tami Ribas, MD; Mariam Dollar, MD; Yetta Barre, MD [address], [place], [place] [address] [phone] Mon-Fri 8:00-5:00 Medicaid- Yes; Tricare - yes  [place] [phone])  Adult nurse HealthCare at [facility], [place]; [place], MD [address] [place], [place], [place] [address] [phone] Mon-Fri  8:00-5:00 Medicaid - No; Tricare - yes  [facility] - [facility] - CPNP [address], [place], [place] [address] [phone] Mon-Weds 8:00-6:00, Thurs-Fri 8:00-5:00, Sat 9:00-12:00 Medicaid - yes; Tricare - yes   [facility] Katharina Caper, MD; [place], [place] [address] [place], [place] [address] [phone] Mon-Fri 8:00-5:00 Medicaid - yes; Tricare - yes  [facility]  Pediatric Providers  [facility] [address], [place], [place] [address] [phone] Sheral Flow: 8am -8pm, Tues, Weds: 8am - 5pm; Fri: 8-1 Medicaid - Yes; Tricare - yes  [facility] Rachel Bo, MD; Laural Benes, MD; Anner Crete, MD; [place], [place]; [place], [place] [address] [place], [place] [address] [phone] M-F 8:30 - 5:00 Medicaid - Call office; Tricare -yes  [facility] Edson Snowball, MD; Shanon Rosser, MD, Chelsea Primus, MD; Shirlyn Goltz, PNP; Wardell Heath, NP [phone] [address], [place], [place] [address] [phone] M-F 8:30 - 5:00, Sat/Sun 8:30 - 12:30 (sick visits) Medicaid - Call office; Tricare -yes  [facility] Melvyn Neth, MD; Karl Luke, PNP; Princess Bruins, MD; [place], [place]; [place], NP; Cynda Familia [address], [place], [place] [address] [phone] M-F 8:30 - 5:00 Medicaid - Call office; Tricare - yes  [facility] Jesusita Oka, MD; Dierdre Highman, MD; Earnest Conroy, MD; Timothy Lasso, MD; [place], MD [phone] [address], [place], [place] [address] [phone] M-Thur: 8:00 - 5:00; Fri: 8:00 - 4:00 Medicaid - yes; Tricare - yes  [facility] [address] [place], [place] [address] [phone] M-F: 8:30- 5:00, closed for lunch 12:30 - 1:00 Medicaid - yes; Tricare -yes  [facility] [address] [place], [place] [address] [phone] M-F 8:00 - 5:00 Medicaid - yes; Tricare - yes  [facility], DO; [place], DO; [place], NP [address], [place], [place] [address] [phone] M-F 8:00 - 5:00, Closed 12-1 for lunch Medicaid - Call; Tricare - yes  [facility] Meredith Mody, MD [address], [place], [place] [address] [phone] M-F: 8:00-5:00, Sat: 8:00 - noon Medicaid - call; Tricare -yes  [facility] Pediatric Providers  [facility] [place], [place] [address] [place], [place], [place] [address] [phone] M-W: 8:00-5:00, Thur: 8:00 -  7:00, Fri: 8:00 - noon Medicaid - yes; Tricare - yes  [facility] - Quay Burow, FNP [address], [place], [place] [address] [phone] M-F 8:00 - 5:00, Closed for lunch 12-1 Medicaid - yes; Tricare - yes  [facility] Pediatric Providers  [facility] at [place], [place], Alinda Money, MD, [place], FNP-C [address] [facility], [address], [place], [place] [address] [phone] M-T 8:00-5:00, Wed-Fri 7:00-6:00 Medicaid - Yes; Tricare -yes  [facility] at [facility], DO; [address], [address] [place], [place] [address] [phone] M-F 8:00 - 5:00, closed for lunch 12-1 Medicaid - Yes; Tricare - yes  [facility] Pediatrics and Internal Medicine  Zachery Dauer, MD; Gladstone Lighter, MD; Collie Siad, MD; Freda Jackson, MD; Rich Number, MD; Darryl Nestle, MD; Melinda Crutch, MD, Audria Nine, MD; Tawanna Cooler, MD; Steffanie Dunn, MD; Byrd Hesselbach, MD; Lucretia Roers, MD [address] [place], [place] [address] [phone] M-F 8:00-5:00 Medicaid - yes; Tricare - yes  [facility] [place], MD (speaks [place] and Hindi) [address] [place], [place] [address] [phone] M-F: 8:30 - 5:00, closed 12:30 - 1 for lunch Medicaid - Yes; Tricare -yes  [facility] Pediatric Providers  [facility] Shanda Bumps, MD; Chanetta Marshall, MD; Laurell Josephs, MD [phone]  [address] [place] [address] [place], [place] [address] [phone] M-Th: 8:00 - 5:30, Fri: 8:00 - 12:00 Medicaid - yes; Tricare - yes  [facility] Pediatrics at [facility], NP; Thora Lance, MD; Orrin Brigham, MD [phone] [address] [place], [place] [address] [phone] M-F: 8:00 - 5:00 Medicaid - yes; Tricare - yes  [facility] [place], NP; Orson Slick, NP; [place] Scarlet, NP; Linton Flemings, MD; Mayford Knife, MD, [place], NP, Emelda Fear, MD; Nida Boatman [address] [place], [place] [address] [phone] M-F: 8:30 - 5:30p Medicaid - yes; Tricare - yes Other locations available as well  [facility], MD; Andrey Campanile, MD; Neville Route, PA-C [address] [place], [place] [address] [phone] M-W: 8:00am - 7:00pm, Thurs: 8:00am - 8:00pm; Fri: 8:00am - 5:00pm, closed daily from 12-1 for lunch Medicaid - yes; Tricare - yes  [facility] Pediatric Providers  [facility]s at Levin Erp, MD; Aggie Cosier, FNP; Bland Span, MD; Tristan Schroeder, MD; [place], PNP; Alesia Banda; [place], [place]; Julian Reil, MD;  [address] [place], [place] [address] [phone] Judie Petit - Caleen Essex: 8am - 5pm, Sat 9-noon Medicaid - Yes; Tricare -yes  [facility] at Jaclynn Guarneri, MD; Yetta Barre, FNP; Lilian Kapur, MD; Mariam Dollar, MD [address] [place], [place] [phone] M-F 8:00 - 5:00 Medicaid - call; Tricare - yes  [facility]- Cruz Condon, MD; [place], [place]; Delora Fuel, MD; Dareen Piano, MD; Trudee Grip, MD; Kizzie Ide, MD; Zebedee Iba; Birdena Crandall, MD; Hinton Dyer, MD; [place], MD [address], [place], [place] [address] [phone] M-F 8:00am - 5:00pm; Sat. 9:00 - 11:00 Medicaid - yes; Tricare - yes  [facility] at [facility], MD [address], [place], [place] [address] [phone] M-F 8:00 - 5:00 Medicaid - [place] Medicaid only; Tricare - yes  [facility] [facility]s - Illene Bolus, MD; Earlene Plater, [place]; Kenyon Ana, MD [address], [place], [place] [address] [phone] M-F 8:00 - 5:00 Medicaid - yes; Tricare - yes  [facility] [address] Pediatrics - Lind Covert, MD; Manson Passey, MD, [facility], MD, [place], MD; Graham, MD; Katrinka Blazing, MD; [address] [place], [place] [address] [phone] M-F: 8-5 Medicaid - yes; Tricare - yes  [facility] - Henrietta Hoover, [place]; Crane, MD; [address], [place], [place] [address] [phone] M-F 8-5 Medicaid - yes; Tricare - yes  [address] [place] Darrol Poke, MD; Tami Ribas, MD; [place], MD; [place], DNP;  [place], PNP [address], [place], [place] [address] [phone] M-F 8-5 Medicaid - yes; Tricare - yes  [facility] Internal Medicine and Pediatrics Delories Heinz, MD; Adrienne Mocha; Ala Bent, MD [address], [place], [place] [address] [phone] M-F 7am - 5 pm Medicaid - call; Tricare - yes  [facility] [place], [place]; Fredia Beets, MD; Roxan Hockey, MD [address] [place], [place] [address] [phone] M-F 8-5 Medicaid - yes; Tricare - yes  [facility] Kae Heller, MD; Sheliah Hatch, MD; Mayford Knife, FNP; Shon Baton, FNP; Tyron Russell, FNP; Ishmael Holter; [facility] - FNP [address], [place], [place] [address] [phone] M-F 8-5 Medicaid- yes; Tricare - yes  [facility]s - Betsy Coder, [place] and [place] Guest, MD; Terrial Rhodes, MD; Hulda Humphrey, MD; Roseanne Reno, MD; [place], [place]; Ala Dach, MD; Fredia Beets, MD; Dimple Casey, MD [address] [place], [place] [address] [phone] M-F: 8-5, Sat: 9-4, Sun 9-12 Medicaid - yes; Tricare - yes  [facility] [place], PNP; Earlene Plater, PNP [address] [place], [place] [address]  [phone] M-F 8 - 5, closed 12-1 for lunch Medicaid - yes; Tricare - yes  [facility]s Kathyrn Lass, MD; Hal Neer, MD; Dimple Casey, MD; [place], DO [address] [place], [place] [address] [phone] M-F 8- 5:30 Medicaid - yes; Tricare - yes  [facility] Pediatrics - Biagio Quint, MD; Rosalia Hammers, MD; Gwenith Daily, MD [address] [place], [place] [address] [phone] Judie Petit: Nicholas Lose; Tues-Fri: 8-5; Sat: 9-12 Medicaid - yes; Tricare - yes  [facility] Pediatrics - Bobbye Morton, MD; Daphane Shepherd, MD; Chestine Spore, MD; Haskell Riling, MD; Kate Sable, MD [address], [place], [place] [address] [phone] Judie PetitMarland Kitchen Nicholas LoseFrancee Nodal: 8-5; Sat: 8:30-12:30 Medicaid - yes;  Tricare - yes  [facility] Heck[facility]s - Beckey Rutter, MD; [place], [place] [address] [place], [place] [address] [phone] Mon-Fri: 8-5 Medicaid - yes; Tricare - yes  [facility] Pediatrics - [place] Lydia, CPNP; [place], ; Dimple Casey, MD; Alisa Graff, MD; Cephus Shelling, MD; [address] [place], [place] [address] [phone] M-F: 8-5, closed 1-2 for lunch Medicaid - yes; Tricare - yes  [facility] - [facility] [place], [place]; [place], [place]; Katrinka Blazing, MD; [place], CPNP; [place], [place]; [place], MD; Earlene Plater, MD [address], [place], [place] [address] [phone] M-Thurs: Nicholas Lose; Fri: 8-6; Sat: 9-12; Sun 2-4 Medicaid - yes; Tricare - yes  [facility] Georgeanna Lea, MD; Evette Cristal, MD; Shea Stakes, FNP; Earney Mallet, DO; [address] [place], [place] [address] [phone] M-F: 8-5 Medicaid - yes; Tricare - yes  [facility] Providers  [facility] - Family Medicine -Collene Mares, MD; [place], NP [address] [place], [place] [address] [phone] M - Fri: 8am - 5pm, closed for lunch 12-1 Medicaid - Yes; Tricare - yes  [facility] Elinor Parkinson, MD; Victory Dakin, MD; [place], DO; Vin[place], MD;Hall, PA; Clent Ridges, [place]; Orvan Falconer, NP [phone] [address], [place], [place] [address] [phone] M-F 8:00 - 5:00, Sat 8:00 - 11:30 Medicaid - yes; Tricare - yes  [facility] Welton Flakes, MD; [place], MD, [address] MD, [place], MD, [place], MD; [place], NP; [place], [place];  [address], [place], [place] [address] [phone] M-F 8:10am - 5:00pm Medicaid - yes; Tricare - yes  [facility] Eustaquio Boyden, MD; [place], NP [address] [place], [place] [address] [phone] M-F 8:00 - 5:00 Medicaid - [place] Medicaid only; Tricare - yes  [facility] [address] [place], MD; [place], NP [address], [place], [place] [address] [phone] M-F 8:00 - 5:00; Closed for lunch 12 - 1:00 Medicaid - yes; Tricare - yes  [facility] Belva Crome, MD; Jonita Albee, FNP [address] [place], [place] [address] [phone] Mon 9-5; Tues/Wed 10-5; Thurs 8:30-5; Fri: 8-12:30 Medicaid - yes; Tricare - yes  [facility] Pediatric Providers  [facility]  [place], MD; [place], [place] [address] [place], [place] [address] [phone] phone [phone] fax M-F 7:15 - 4:30 Medicaid - yes; Tricare - yes  [place] [facility] Karilyn Cota, MD; [place], DO [address] [place], [place] [address] [phone] M-Fri: 8:30 - 5:00, closed for lunch everyday noon - 1pm Medicaid - Yes; Tricare - yes  [facility] [place], MD; Reuel Boom, MD; Dimas Aguas, MD; Neita Carp, MD; [place], [place]; Bonnita Nasuti, [place]; [place], [place]; [place], [place]; [place],  PA [address] [place], [place] [address] [phone] M-Thurs: 7:30am - 7:00pm; Friday 7:30am - 4pm; Sat: 8:00 - 1:00 Medicaid - Yes; Tricare - yes  [place] [facility] Norval Morton, MD; Conni Elliot, MD; Carroll Kinds, MD; [place], DO [address], [place], [place] [address] [phone] M-Thur: 8:00 - 5:00, Fri: 8:00 - Noon Medicaid - yes; Tricare - yes No [facility] [employer]  [place] [facility] [place], MD; Nadine Counts, DO; [place], NP; Daphine Deutscher, NP; Lequita Halt, NP; [place] Sia, NP; Reginia Forts, NP; Darlyn Read, MD; [place], [place] [address], [place], [place] [address] [phone] M-F 8:00 - 5:00 Medicaid - yes; Tricare - yes  [facility], FNP-C; [place], FNP-C [address] [place], [place] [address] [phone] M, W, R 8:00-5:00, Tues: 8:00am - 7:00pm; Fri 8:00 - noon Medicaid - Yes; Tricare - yes  [facility], MD [address] [place], [place] [address] [phone]  M-Thurs 8:30-5:30, Fri: 8:30-12:30pm Medicaid - Yes; Tricare - N   Considering Waterbirth? Guide for patients at [facility]) Why consider waterbirth? Gentle birth for babies  Less pain medicine used in labor  May allow for passive descent/less pushing  May reduce perineal tears  More mobility and instinctive maternal position changes  Increased maternal relaxation   Is waterbirth safe? What are the risks of infection, drowning or other complications? Infection:  Very low risk (3.7 % for tub vs 4.8% for bed)  7 in 8000 waterbirths with documented infection  Poorly cleaned equipment most common cause  Slightly lower group B strep transmission rate  Drowning  Maternal:  Very low risk  Related to seizures or fainting  Newborn:  Very low risk. No evidence of increased risk of respiratory problems in multiple large studies  Physiological protection from breathing under water  Avoid underwater birth if there are any fetal complications  Once baby's head is out of the water, keep it out.  Birth complication  Some reports of cord trauma, but risk decreased by bringing baby to surface gradually  No evidence of increased risk of shoulder dystocia. Mothers can usually change positions faster in water than in a bed, possibly aiding the maneuvers to free the shoulder.   There are 2 things you MUST do to have a waterbirth with [facility]: Attend a waterbirth class at [employer] [facility]   3rd Wednesday of every month from 7-9 pm (virtual during COVID) [employer] at [email or web address] or [email or web address] or by calling [phone] Bring [place] the certificate from the class to your prenatal appointment or send via MyChart Meet with a midwife at 36 weeks* to see if you can still plan a waterbirth and to sign the consent.   *We also recommend that you schedule as many of your prenatal  visits with a midwife as possible.    Helpful information: You may want to bring a bathing suit top to the hospital to wear during labor but this is optional.  All other supplies are provided by the hospital. Please arrive at the hospital with signs of active labor, and do not wait at home until late in labor. It takes 45 min- 1 hour for fetal monitoring, and check in to your room to take place, plus transport and filling of the waterbirth tub.    Things that would prevent you from having a waterbirth: Premature, <37wks  Previous cesarean birth  Presence of thick meconium-stained fluid  Multiple gestation (Twins, triplets, etc.)  Uncontrolled diabetes or gestational diabetes requiring medication  Hypertension diagnosed in pregnancy or preexisting hypertension (gestational hypertension, preeclampsia, or chronic hypertension) Fetal growth restriction (your baby measures less than 10th percentile on ultrasound) Heavy vaginal bleeding  Non-reassuring fetal heart rate  Active infection (MRSA, etc.). Group B Strep is NOT a contraindication for waterbirth.  If your labor has to be induced and induction method requires continuous monitoring of the baby's heart rate  Other risks/issues identified by your obstetrical provider   Please remember that birth is unpredictable. Under certain unforeseeable circumstances your provider may advise against giving birth in the tub. These decisions will be made on a case-by-case basis and with the safety of you and your baby as our highest priority.          Childbirth Education Options: [facility] Department Classes:  Childbirth education classes can help you get ready for a positive parenting experience. You can also meet other expectant parents and get free stuff for your baby. Each class runs for five weeks on the same night and costs $45 for the mother-to-be and her support person. Medicaid covers the cost if you are eligible. Call  [phone] to register. [facility] Childbirth Education: Classes can vary in availability and schedule is subject to change. For most up-to-date information please visit [email or web address] to review and register.    We highly recommend childbirth education to help you plan for labor and begin practicing coping skills (which will be needed with or without pain meds).  [place] Childbirth Education Options: Sign up by visiting [email or web address]  Childbirth ~ Self-Paced eClass (English and Spanish) This online class offers you the freedom to complete a childbirth education series in the comfort of your own home at your own pace.  Childbirth Class (In-Person 4-Week Series  or on Saturdays, Virtual 4-Week Series ~ [place]) This interactive in-person class series will help you and your partner prepare for your birth experience. Topics include: Labor & Birth, Comfort Measures, Breathing Techniques, Massage, Medical Interventions, Pain Management Options, Cesarean Birth, Postpartum Care, and Newborn Care  Comfort Techniques for Labor ~ In-Person Class [facility]) This interactive class is designed for parents-to-be who want to learn & practice hands-on skills to help relieve some of the discomfort of labor and encourage their babies to rotate toward the best position for birth. Moms and their partners will be able to try a variety of labor positions with birth balls and rebozos as well as practice breathing, relaxation, and visualization techniques.  Natural Childbirth Class (In-Person 5-Week Series, In-Person on Saturdays or Virtual 5-Week Series ~ [place]) This class series is designed for expectant parents who want to learn and practice natural methods of coping with the process of labor and childbirth.  Cesarean Birth Self-Paced eClass (English and Spanish) This online course provides comprehensive information you can trust as you prepare for a possible cesarean  birth. In this class, you'll learn how to make your birth and recovery comfortable and joyful through instructive video clips, animations, and activities.  Waterbirth ~ Airline pilot Interested in a waterbirth? In addition to a consultation with your credentialed waterbirth provider, this free, informational online class will help you discover whether waterbirth is the right fit for you. Not all obstetrical practices offer waterbirth, so check with your healthcare provider.  Tour Probation officer) - [facility] [employer] our 4 minute video tour of [facility] located in [place].   [place] Parenting Education Options:  Pregnancy 101 (Virtual) Congratulations on your pregnancy! This class is geared toward moms in their first trimester, but everyone is welcome. We are excited to guide you through all aspects of supporting a healthy pregnancy. You will learn what to expect at routine prenatal care appointments, common postpartum adjustments, basic infant safety, and breastfeeding.  Successful Partnering & Parenting ~ In-Person Workshop [facility]) This workshop inspires and equips partners of all economic levels, ages, and cultures to confidently care for their infants, support the birthing persons, and navigate their own transformations into new partners and parents. Learning activities are geared towards supporting partner, but moms are welcome to attend.  'Baby & Me' Parenting Group (Virtual on Wednesdays at 11am) Enjoy this time discussing newborn & infant parenting topics and family adjustment issues with other new parents in a relaxed environment. Each week brings a new speaker or baby-centered activity. This group offers support and connection to parents as they journey through the adjustments and struggles of that sometimes overwhelming first year after the birth of a child.  Baby Safety, CPR, & Choking Class ~ Virtual This  life-saving information is meant to encourage parents as they learn important safety and prevention tips as well as infant CPR and relief of choking.  Breastfeeding Class (In-Person in [place] or [employer]) Families learn what to expect in the first days and weeks of breastfeeding your newborn. IF YOU ARE AN EMPLOYEE TAKING THIS CLASS FOR CREDIT, DO NOT register yourself. Please e-mail [email or web address].   Breastfeeding Self-Paced eClass (English & Spanish) Families learn what to expect in the first days and weeks of breastfeeding your newborn.  Caring for Baby ~ In-Person, Virtual or Self-Paced Class This in-person class is for both expectant and adoptive parents who want to learn and practice the most up-to-date newborn care for their babies. Focus is on birth through the first six weeks of life.  CPR & Choking Relief for Infants & Children ~ In-Person Class [facility]) This in-person course is designed for any parent, expectant parent, or adult who cares for infants or children. Participants learn and demonstrate cardiopulmonary resuscitation and choking relief procedures for both infants and children.  Grandparent Love ~ In-Person Class Grandparents will learn the most updated infant care and safety recommendations. They will discover ways to support their own children during the transition into the parenting role and receive tips on communicating with the new parents.  [place] Parenting Support Group Options:  Bereavement Grief Support Group (Pregnancy/Infant Loss) - Virtual This is an ongoing experience that meets once a month and is designed to help you honor the past, assist you in discovering tools to strengthen you today, and aid you in developing hope for the future.  Breastfeeding & Pumping Support Group (In-Person on Thursdays at 12pm or Virtual on Tuesdays at 5pm) Join [place] in-person each Thursday starting June 1st, 2023 at 12pm! This support group is free for all  families looking for breastfeeding and/or pumping support.   Community-Based Childbirth Education Options:  [facility] Department Classes:  Childbirth education classes can help you get ready for a positive parenting experience. You can also meet other expectant parents and get free stuff for your baby. Each class runs for five weeks on the same night and costs $45 for the mother-to-be and her support person. Medicaid covers the cost if you are eligible. Call [phone] to register.  [facility] [employer] offers a variety of programs for  the [facility] community and is another great way to get connected. Please go to [email or web address] for more information.  Childbirth With A Twist! Be informed of your options, get educated on birth, understand what your body is doing, learn how to cope, and have a lot of fun and laughs all while doing it either from the comfort of your couch OR in our cozy office and classroom space near the [place] airport. If you are taking a virtual class, then class is taught LIVE, so you can ask questions and receive answers in real-time from an experienced doula and childbirth educator.  This virtual childbirth education class will meet for five instruction times online.  Although we are based in [place], [place], this virtual class is open to anyone in the world. Please visit: [email or web address] for more information.  Books We Love: The Doula Guide to Childbirth by Harland German and Otila Back The First-Time Parent's Childbirth Handbook by Dr. Amie Critchley, CNM The Birth Partner by Truddie Crumble

## 2023-07-28 NOTE — Progress Notes (Signed)
New OB Intake  I connected with Ruben Gottron  on 07/28/23 at  1:15 PM EST by MyChart Video Visit and verified that I am speaking with the correct person using two identifiers. Nurse is located at [facility] and pt is located at home.  I discussed the limitations, risks, security and privacy concerns of performing an evaluation and management service by telephone and the availability of in person appointments. I also discussed with the patient that there may be a patient responsible charge related to this service. The patient expressed understanding and agreed to proceed.  I explained I am completing New OB Intake today. We discussed EDD of 02/18/2024, by Last Menstrual Period. Pt is [id number]. I reviewed her allergies, medications and Medical/Surgical/OB history.    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 Discussed the nature of our practice with multiple providers including residents and students. Due to the size of the practice, the delivering provider may not be the same as those providing prenatal care.   Patient is interested in water birth. Offered upcoming OB visit with CNM to discuss further.  MyChart/Babyscripts MyChart access verified. I explained pt will have some visits in office and some virtually. Babyscripts instructions given and order placed. Patient verifies receipt of registration text/e-mail. Account successfully created and app downloaded. If patient is a candidate for Optimized scheduling, add to sticky note.   Blood Pressure Cuff/Weight Scale Patient is self-pay; explained patient will be given BP cuff at first prenatal appt. Explained after first prenatal appt pt will  check weekly and document in Babyscripts.  Anatomy US Explained first scheduled [place] will be around 19 weeks. Anatomy US scheduled for 09/29/2023 at 1:15pm.  Is patient a CenteringPregnancy candidate?  Declined Declined due to  considering mom baby     Is patient a Mom+Baby Combined Care candidate?  considering    If accepted, confirm patient does not intend to move from the area for at least 12 months, then notify Mom+Baby staff  Interested in [place]? If yes, send referral and doula dot phrase.   Is patient a candidate for Babyscripts Optimization? No, due to hx of ectopic 2x  and SAB   First visit review I reviewed new OB appt with patient. Explained pt will be seen by Albertine Grates, FNP at first visit. Discussed Avelina Laine genetic screening with patient. Panorama and Horizon.. Routine prenatal labs is needed at new ob visit.  Last Pap No results found for: "DIAGPAP"  Lowry Bowl, CMA 07/28/2023  1:04 PM

## 2023-07-29 IMAGING — US US OB < 14 WEEKS - US OB TV
1 series · 15 of 28 positions shown · non-contrast
Comparison: None.

CLINICAL DATA: Vaginal bleeding, pelvic cramping, LMP 07/25/2021

EXAM:
OBSTETRIC <14 WK US AND TRANSVAGINAL OB US
TECHNIQUE: Both transabdominal and transvaginal ultrasound examinations were
performed for complete evaluation of the gestation as well as the
maternal uterus, adnexal regions, and pelvic cul-de-sac.
Transvaginal technique was performed to assess early pregnancy.

[Series 1: us ob < 14 weeks - us ob tv · 15 of 59 slices shown]
[im 1/59]
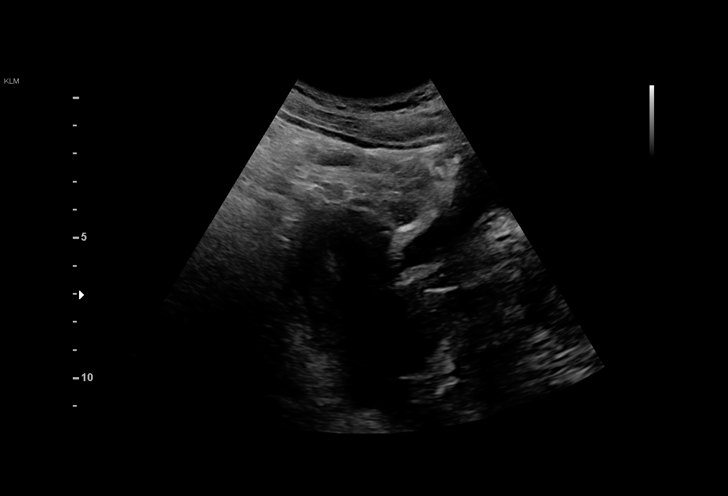
[im 5/59]
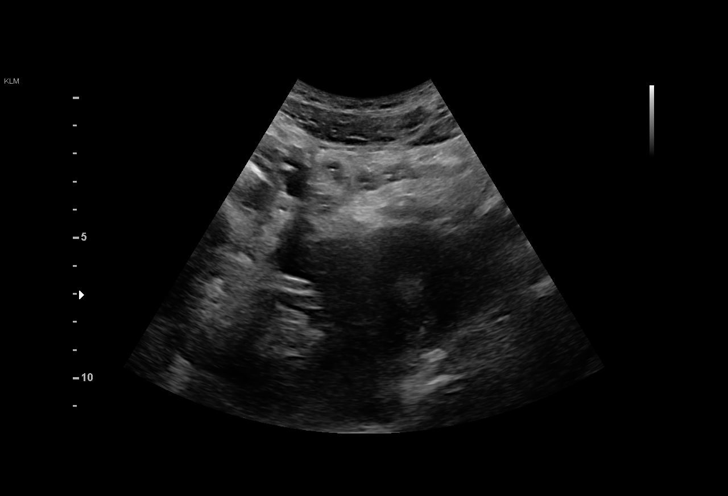
[im 9/59]
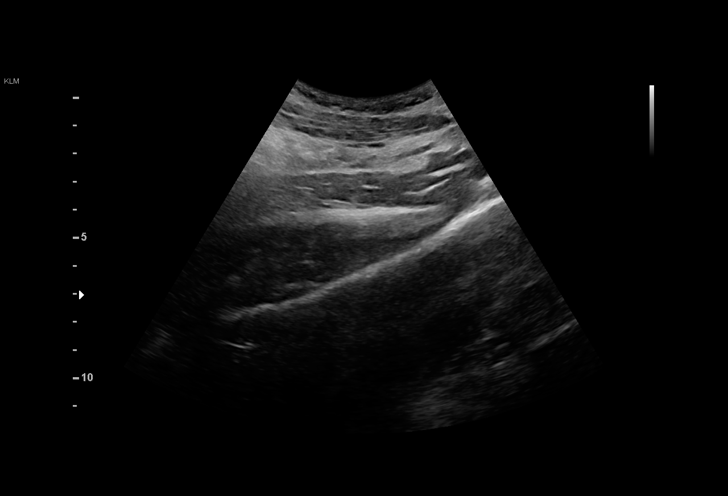
[im 13/59]
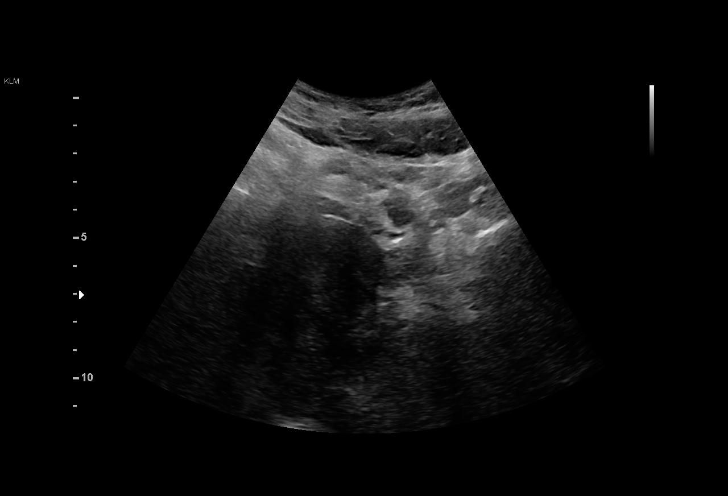
[im 18/59]
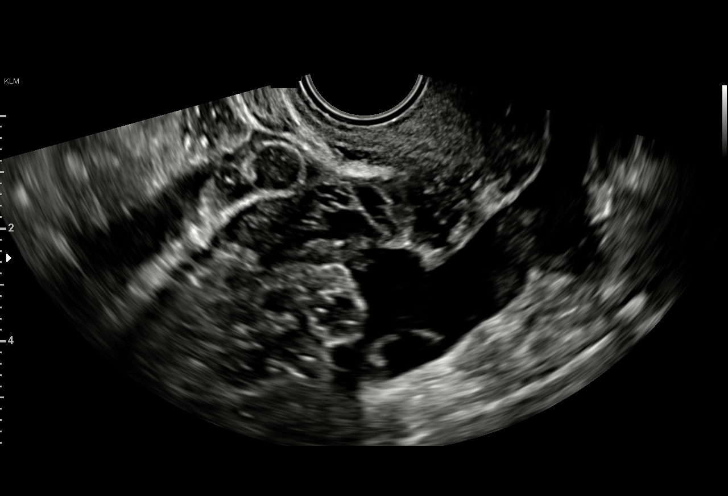
[im 22/59]
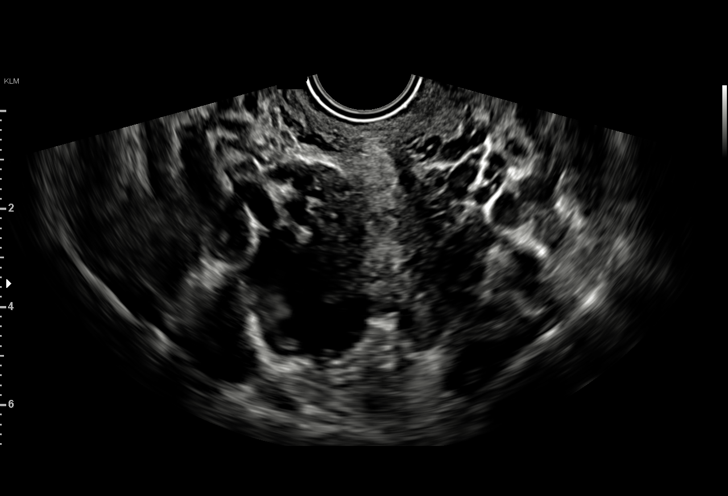
[im 26/59]
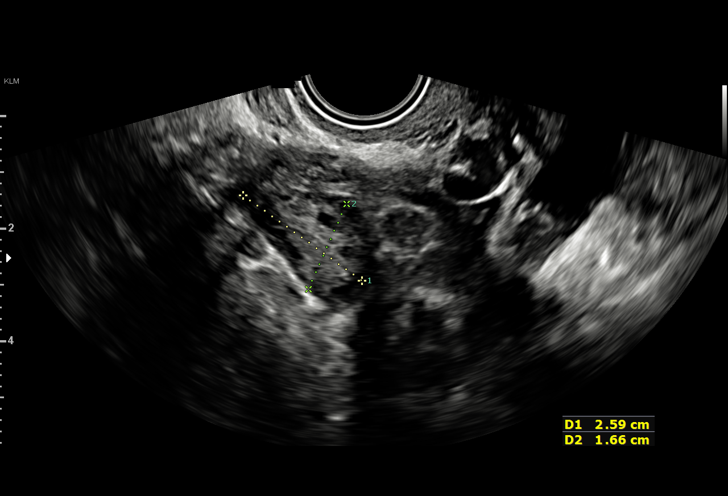
[im 31/59]
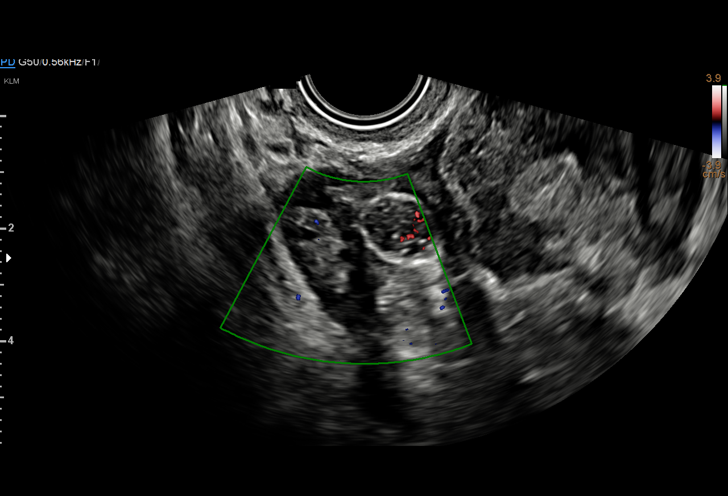
[im 33/59]
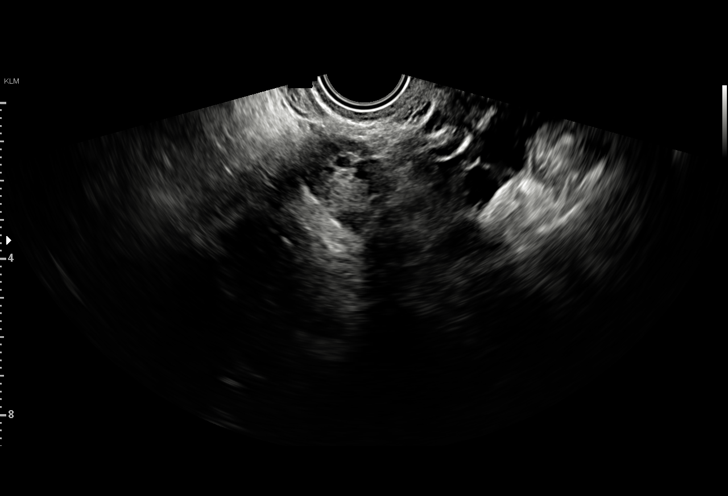
[im 37/59]
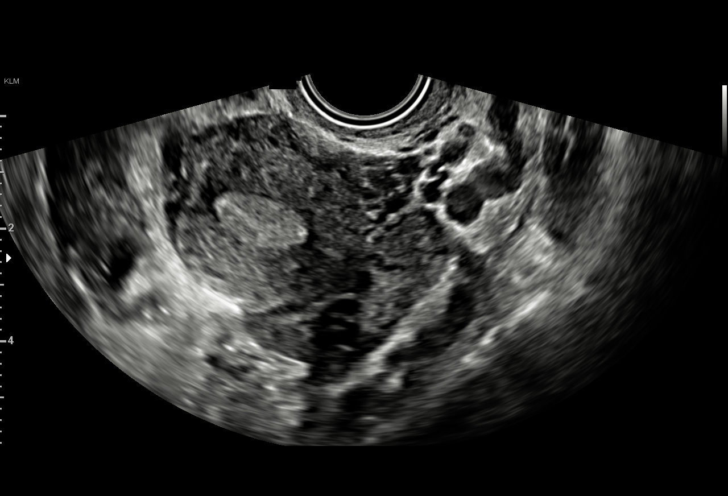
[im 41/59]
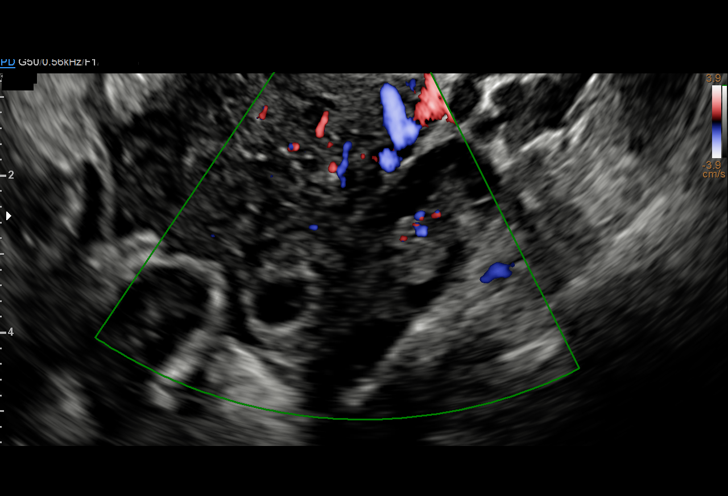
[im 46/59]
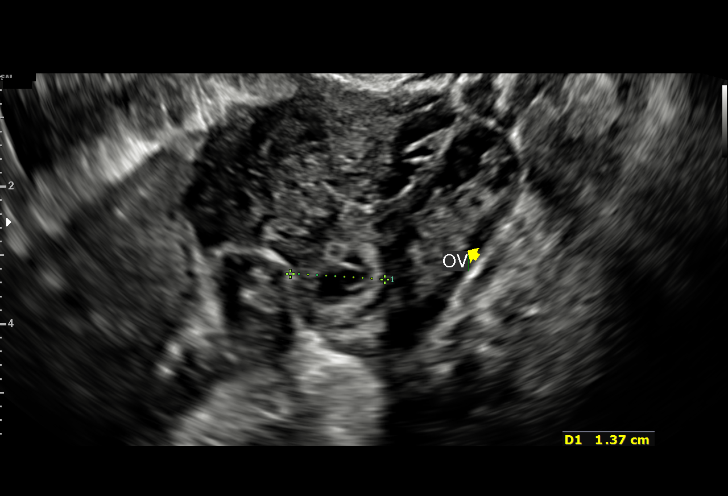
[im 50/59]
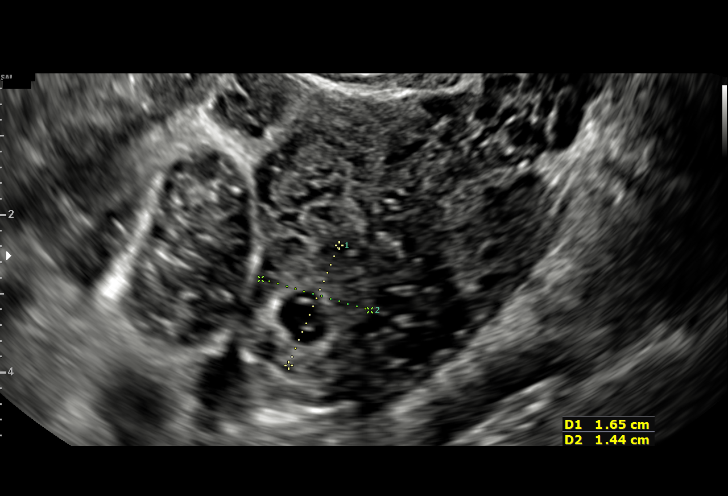
[im 54/59]
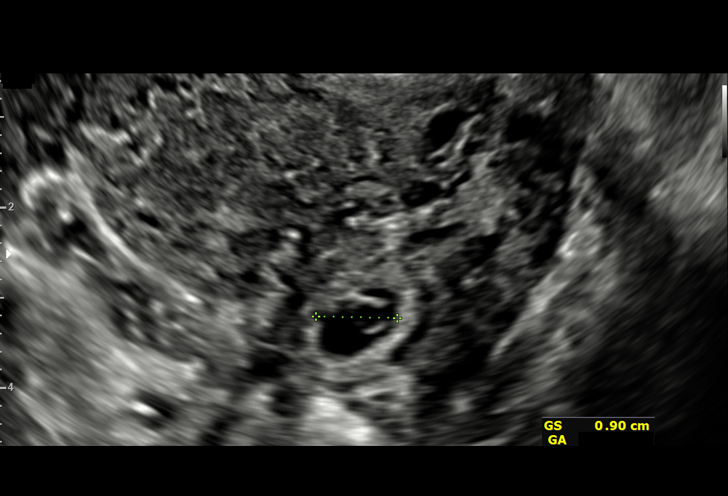
[im 59/59]
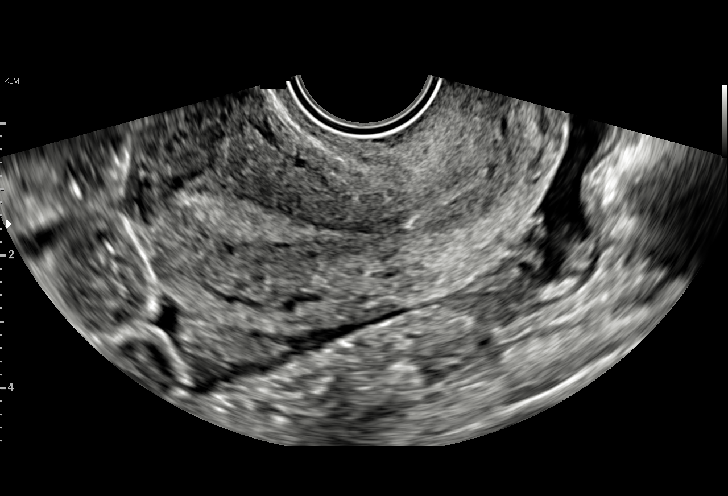

[15 of 28 positions shown; findings below may reference images not displayed]

FINDINGS: Intrauterine gestational sac: None identified

Maternal uterus/adnexae: The uterus is anteverted. No intrarenal
masses are seen. The cervix is closed and is unremarkable. The
endometrial stripe is uniform measuring 8 mm in thickness. The right
ovary is unremarkable. Immediately adjacent to the left ovary is a
cystic structure demonstrating echogenic margins and a a internal
yolk sac compatible with an ectopic gestational sac either within or
immediately adjacent to the left ovary. This is best appreciated on
cine images. There is a moderate amount of complex appearing fluid
within the cul-de-sac demonstrating echogenic internal debris best
appreciated on image # 19 likely representing blood product in the
setting of a ruptured ectopic pregnancy.
IMPRESSION: Extrauterine gestational sac immediately adjacent or within the left
ovary with moderate complex appearing fluid within the pelvis likely
representing blood product in keeping with a ruptured ectopic
pregnancy.

## 2023-08-03 IMAGING — US US OB < 14 WEEKS - US OB TV
1 series · 15 of 28 positions shown · non-contrast
Comparison: Comparison is made with August 05, 2021.

CLINICAL DATA: A 20-year-old female presents for evaluation of LEFT
ectopic pregnancy with continued pain. Current beta hCG [DATE],
previously [DATE]. Gestational age by last menstrual cycle 2 weeks 2
days by report.

EXAM:
OBSTETRIC <14 WK US AND TRANSVAGINAL OB US
TECHNIQUE: Transvaginal ultrasound was performed for complete evaluation of the
gestation as well as the maternal uterus, adnexal regions, and
pelvic cul-de-sac.

[Series 1: us ob < 14 weeks - us ob tv · 33 acquisitions, 15 frames shown]
[im 1/33]
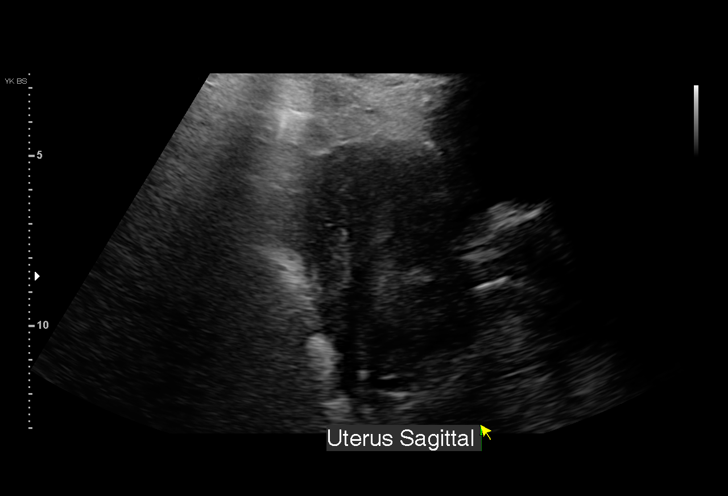
[im 3/33]
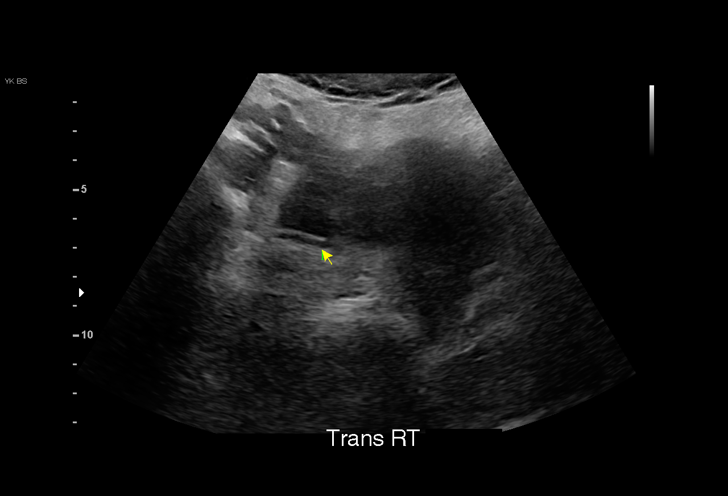
[im 5/33]
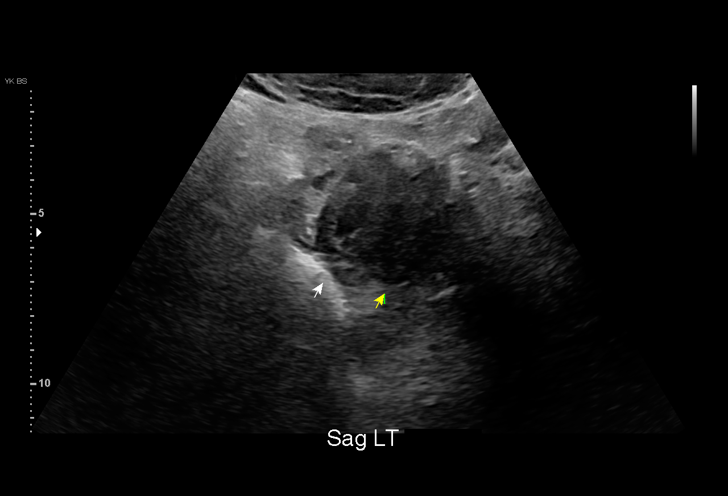
[im 8/33]
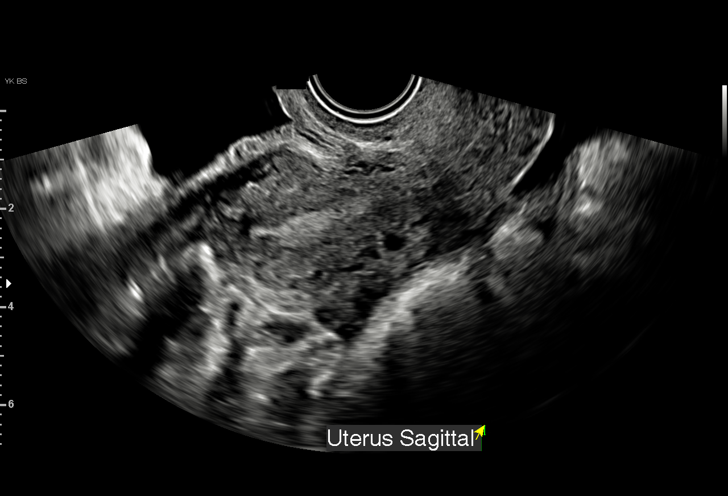
[im 10/33]
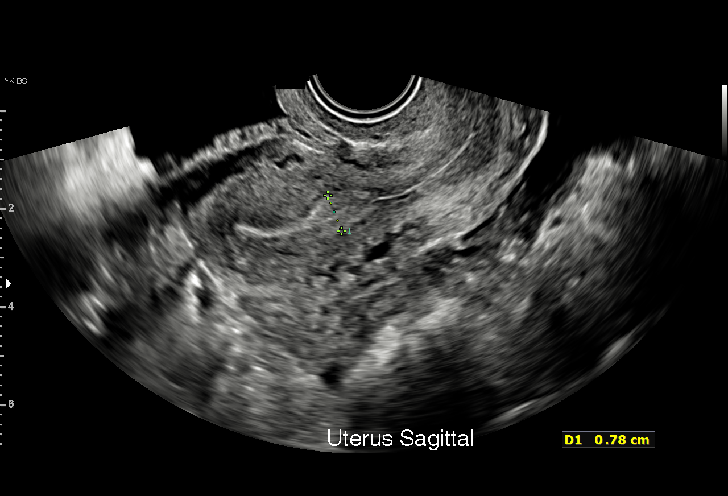
[im 12/33]
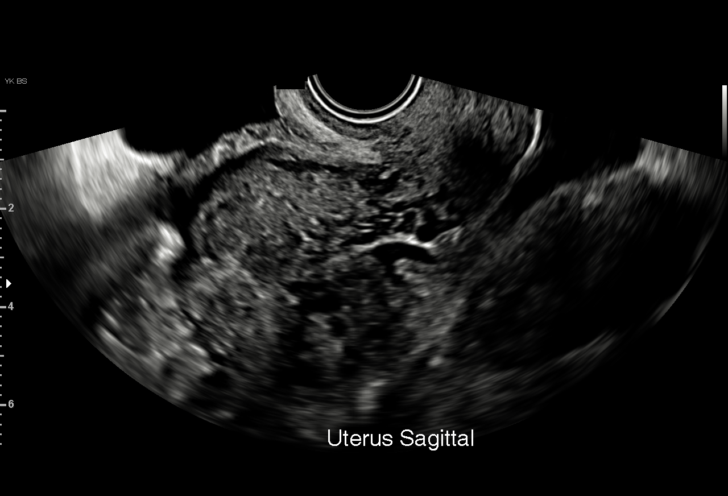
[im 15/33]
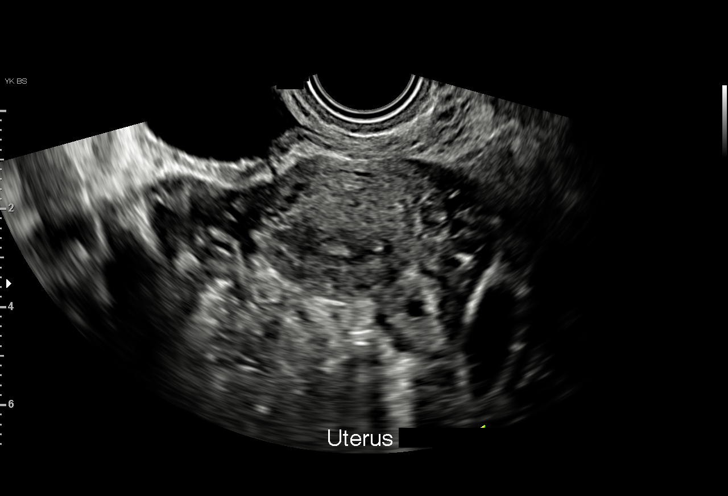
[im 17/33]
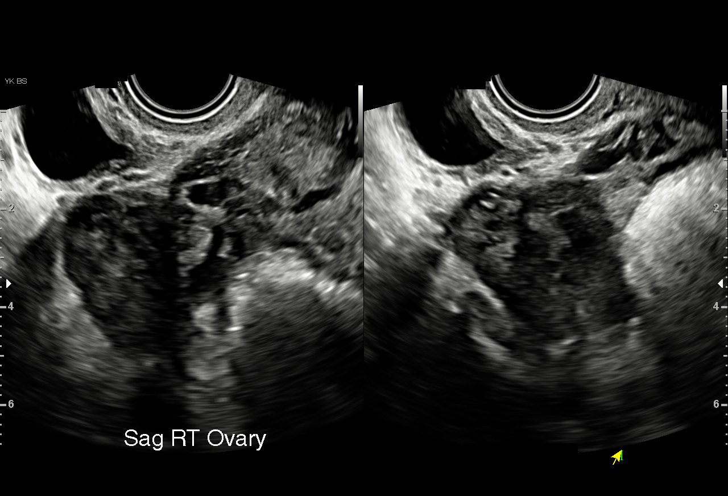
[im 18/33]
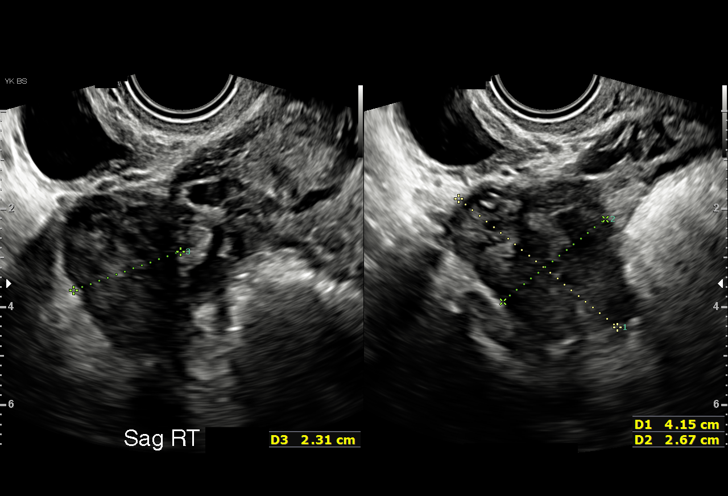
[im 21/33]
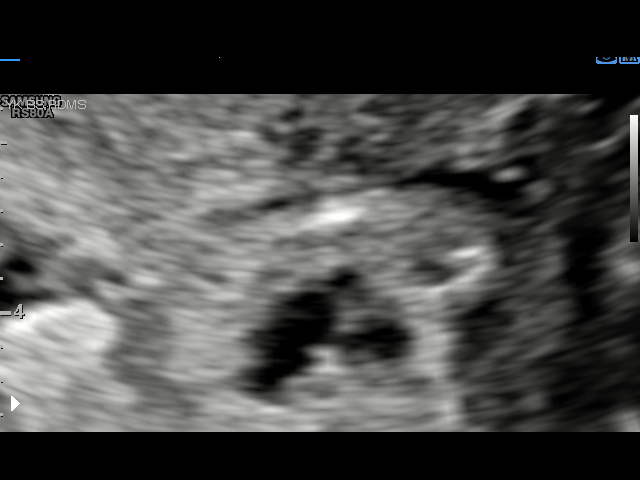
[im 23/33]
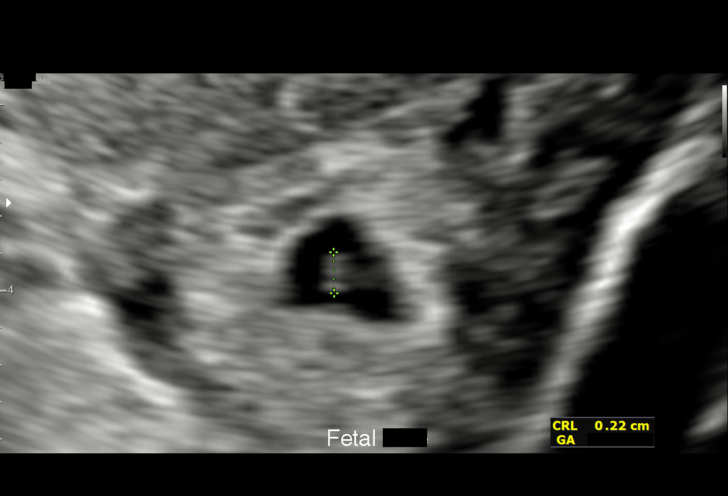
[im 25/33]
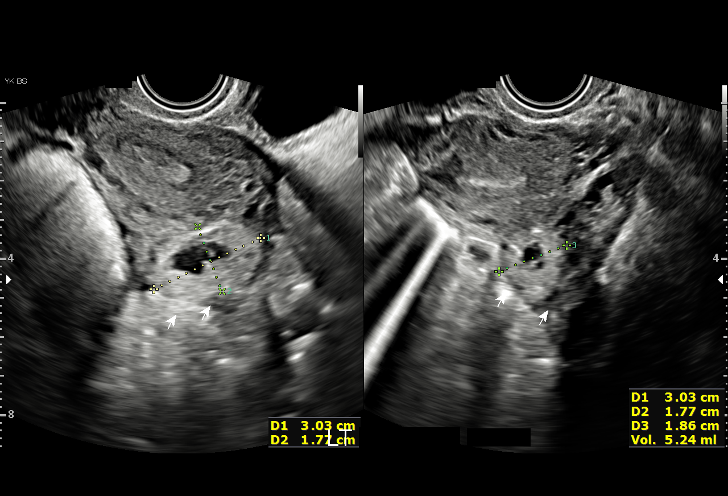
[im 28/33]
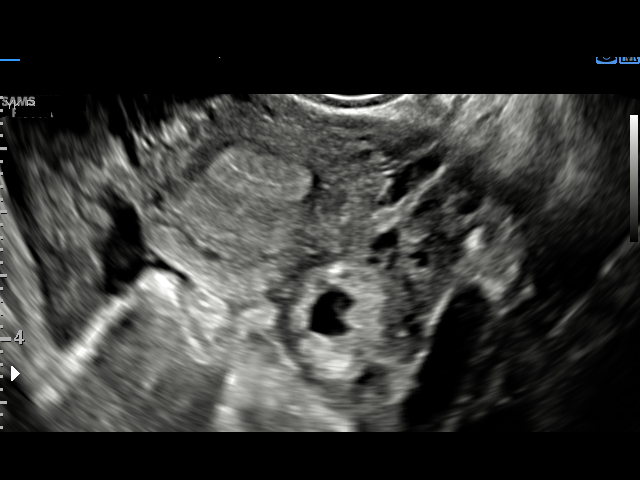
[im 30/33]
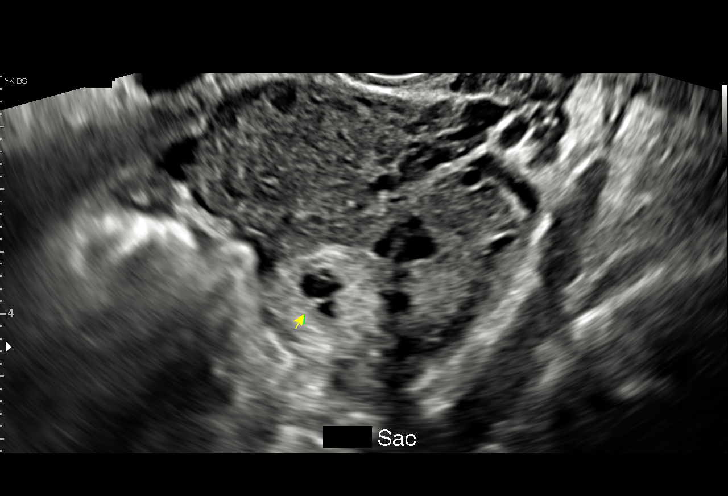
[im 33/33]
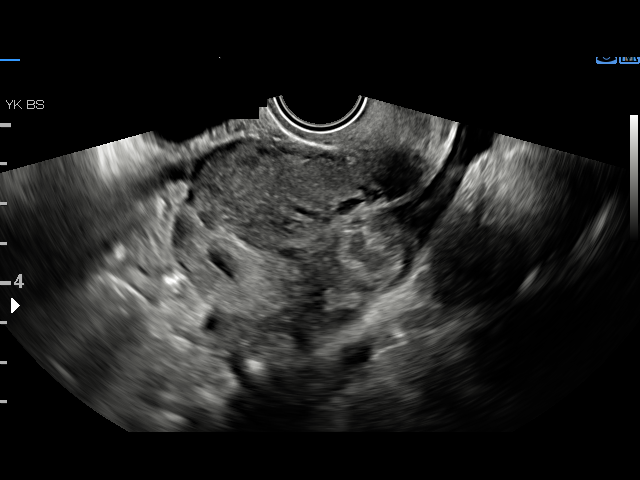

[15 of 28 positions shown; findings below may reference images not displayed]

FINDINGS: Intrauterine gestational sac: No signs of intrauterine gestation as
before. LEFT adnexal ectopic pregnancy is seen adjacent to the LEFT
ovary as before but with interval enlargement and now with visible
fetal pole.

Yolk sac:  Visualized

Embryo:  Visualized

Cardiac Activity: Visualized

Heart Rate: 70 bpm

CRL:   2.2 mm   5 w 5 d

Maternal uterus/adnexae:

Mild endometrial thickening slightly less pronounced than on
previous imaging.

Free fluid in the pelvis of similar volume to the prior study mildly
heterogeneous echoes within the fluid in the cul-de-sac.

RIGHT ovary is unremarkable.

LEFT ovary is adjacent to an enlarging ectopic that displays a
heartbeat and now has a visible fetal pole. Current measurements of
the ectopic pregnancy 3 x 1.8 x 1.9 cm as compared to 1.7 x 1.4 x
1.4 cm.
IMPRESSION: LEFT adnexal ectopic pregnancy now with visible fetal pole and signs
of heartbeat, associated with enlargement and increase in echogenic
rim. Presence of heterogeneous fluid in the cul-de-sac remains
highly suspicious for rupture. This is not substantially changed in
volume compared to the previous exam but perhaps slightly more
complex.

Critical Value/emergent results were called by telephone at the time
of interpretation on 08/10/2021 at [DATE] to provider MYKA BASALDUA
, who verbally acknowledged these results.

## 2023-08-04 ENCOUNTER — Other Ambulatory Visit (HOSPITAL_COMMUNITY)
Admission: RE | Admit: 2023-08-04 | Discharge: 2023-08-04 | Disposition: A | Discharge status: Home / Self Care | Payer: Medicaid Other | Source: Ambulatory Visit | Attending: Obstetrics and Gynecology | Admitting: Obstetrics and Gynecology

## 2023-08-04 ENCOUNTER — Ambulatory Visit (INDEPENDENT_AMBULATORY_CARE_PROVIDER_SITE_OTHER): Payer: Self-pay | Admitting: Obstetrics and Gynecology

## 2023-08-04 ENCOUNTER — Encounter: Payer: Self-pay | Admitting: Family Medicine

## 2023-08-04 ENCOUNTER — Encounter: Payer: Self-pay | Admitting: Obstetrics and Gynecology

## 2023-08-04 VITALS — BP 110/71 | HR 66 | Wt 152.5 lb

## 2023-08-04 DIAGNOSIS — Z3491 Encounter for supervision of normal pregnancy, unspecified, first trimester: Secondary | ICD-10-CM | POA: Insufficient documentation

## 2023-08-04 DIAGNOSIS — Z8759 Personal history of other complications of pregnancy, childbirth and the puerperium: Secondary | ICD-10-CM | POA: Insufficient documentation

## 2023-08-04 DIAGNOSIS — Z3A11 11 weeks gestation of pregnancy: Secondary | ICD-10-CM

## 2023-08-04 DIAGNOSIS — Z3481 Encounter for supervision of other normal pregnancy, first trimester: Secondary | ICD-10-CM

## 2023-08-04 DIAGNOSIS — Z31430 Encounter of female for testing for genetic disease carrier status for procreative management: Secondary | ICD-10-CM

## 2023-08-04 MED ORDER — ASPIRIN 81 MG PO TBEC
81.0000 mg | DELAYED_RELEASE_TABLET | Freq: Every day | ORAL | 2 refills | Status: DC
Start: 2023-08-04 — End: 2024-02-10

## 2023-08-04 NOTE — Patient Instructions (Signed)
 Considering Waterbirth? Guide for patients at [facility]) Why consider waterbirth? Gentle birth for babies  Less pain medicine used in labor  May allow for passive descent/less pushing  May reduce perineal tears  More mobility and instinctive maternal position changes  Increased maternal relaxation   Is waterbirth safe? What are the risks of infection, drowning or other complications? Infection:  Very low risk (3.7 % for tub vs 4.8% for bed)  7 in 8000 waterbirths with documented infection  Poorly cleaned equipment most common cause  Slightly lower group B strep transmission rate  Drowning  Maternal:  Very low risk  Related to seizures or fainting  Newborn:  Very low risk. No evidence of increased risk of respiratory problems in multiple large studies  Physiological protection from breathing under water  Avoid underwater birth if there are any fetal complications  Once baby's head is out of the water, keep it out.  Birth complication  Some reports of cord trauma, but risk decreased by bringing baby to surface gradually  No evidence of increased risk of shoulder dystocia. Mothers can usually change positions faster in water than in a bed, possibly aiding the maneuvers to free the shoulder.   There are 2 things you MUST do to have a waterbirth with [facility]: Attend a waterbirth class at [employer] [facility]   3rd Wednesday of every month from 7-9 pm (virtual during COVID) [employer] at [email or web address] or [email or web address] or by calling [phone] Bring [place] the certificate from the class to your prenatal appointment or send via MyChart Meet with a midwife at 36 weeks* to see if you can still plan a waterbirth and to sign the consent.   *We also recommend that you schedule as many of your prenatal visits with a midwife as possible.    Helpful information: You may want to bring a bathing suit top to the hospital  to wear during labor but this is optional.  All other supplies are provided by the hospital. Please arrive at the hospital with signs of active labor, and do not wait at home until late in labor. It takes 45 min- 1 hour for fetal monitoring, and check in to your room to take place, plus transport and filling of the waterbirth tub.    Things that would prevent you from having a waterbirth: Premature, <37wks  Previous cesarean birth  Presence of thick meconium-stained fluid  Multiple gestation (Twins, triplets, etc.)  Uncontrolled diabetes or gestational diabetes requiring medication  Hypertension diagnosed in pregnancy or preexisting hypertension (gestational hypertension, preeclampsia, or chronic hypertension) Fetal growth restriction (your baby measures less than 10th percentile on ultrasound) Heavy vaginal bleeding  Non-reassuring fetal heart rate  Active infection (MRSA, etc.). Group B Strep is NOT a contraindication for waterbirth.  If your labor has to be induced and induction method requires continuous monitoring of the baby's heart rate  Other risks/issues identified by your obstetrical provider   Please remember that birth is unpredictable. Under certain unforeseeable circumstances your provider may advise against giving birth in the tub. These decisions will be made on a case-by-case basis and with the safety of you and your baby as our highest priority.    Updated 10/02/21 We highly recommend childbirth education to help you plan for labor and begin practicing coping skills (which will be needed with or without pain meds).  [place] Childbirth Education Options: Sign up by visiting [email or web address]  Childbirth ~ Self-Paced eClass (English and  Spanish) This online class offers you the freedom to complete a childbirth education series in the comfort of your own home at your own pace.  Childbirth Class (In-Person 4-Week Series  or on Saturdays, Virtual 4-Week Series ~  [place]) This interactive in-person class series will help you and your partner prepare for your birth experience. Topics include: Labor & Birth, Comfort Measures, Breathing Techniques, Massage, Medical Interventions, Pain Management Options, Cesarean Birth, Postpartum Care, and Newborn Care  Comfort Techniques for Labor ~ In-Person Class [facility]) This interactive class is designed for parents-to-be who want to learn & practice hands-on skills to help relieve some of the discomfort of labor and encourage their babies to rotate toward the best position for birth. Moms and their partners will be able to try a variety of labor positions with birth balls and rebozos as well as practice breathing, relaxation, and visualization techniques.  Natural Childbirth Class (In-Person 5-Week Series, In-Person on Saturdays or Virtual 5-Week Series ~ [place]) This class series is designed for expectant parents who want to learn and practice natural methods of coping with the process of labor and childbirth.  Cesarean Birth Self-Paced eClass (English and Spanish) This online course provides comprehensive information you can trust as you prepare for a possible cesarean birth. In this class, you'll learn how to make your birth and recovery comfortable and joyful through instructive video clips, animations, and activities.  Waterbirth ~ Airline pilot Interested in a waterbirth? In addition to a consultation with your credentialed waterbirth provider, this free, informational online class will help you discover whether waterbirth is the right fit for you. Not all obstetrical practices offer waterbirth, so check with your healthcare provider.  Tour Probation officer) - [facility] [employer] our 4 minute video tour of [facility] located in [place].   [place] Parenting Education Options:  Pregnancy 101 (Virtual) Congratulations on your pregnancy!  This class is geared toward moms in their first trimester, but everyone is welcome. We are excited to guide you through all aspects of supporting a healthy pregnancy. You will learn what to expect at routine prenatal care appointments, common postpartum adjustments, basic infant safety, and breastfeeding.  Successful Partnering & Parenting ~ In-Person Workshop [facility]) This workshop inspires and equips partners of all economic levels, ages, and cultures to confidently care for their infants, support the birthing persons, and navigate their own transformations into new partners and parents. Learning activities are geared towards supporting partner, but moms are welcome to attend.  'Baby & Me' Parenting Group (Virtual on Wednesdays at 11am) Enjoy this time discussing newborn & infant parenting topics and family adjustment issues with other new parents in a relaxed environment. Each week brings a new speaker or baby-centered activity. This group offers support and connection to parents as they journey through the adjustments and struggles of that sometimes overwhelming first year after the birth of a child.  Baby Safety, CPR, & Choking Class ~ Virtual This life-saving information is meant to encourage parents as they learn important safety and prevention tips as well as infant CPR and relief of choking.  Breastfeeding Class (In-Person in [place] or [employer]) Families learn what to expect in the first days and weeks of breastfeeding your newborn. IF YOU ARE AN EMPLOYEE TAKING THIS CLASS FOR CREDIT, DO NOT register yourself. Please e-mail [email or web address].   Breastfeeding Self-Paced eClass (English & Spanish) Families learn what to expect in the first days and weeks of breastfeeding your newborn.  Caring  for Baby ~ In-Person, Virtual or Self-Paced Class This in-person class is for both expectant and adoptive parents who want to learn and practice the most up-to-date newborn care for their  babies. Focus is on birth through the first six weeks of life.  CPR & Choking Relief for Infants & Children ~ In-Person Class [facility]) This in-person course is designed for any parent, expectant parent, or adult who cares for infants or children. Participants learn and demonstrate cardiopulmonary resuscitation and choking relief procedures for both infants and children.  Grandparent Love ~ In-Person Class Grandparents will learn the most updated infant care and safety recommendations. They will discover ways to support their own children during the transition into the parenting role and receive tips on communicating with the new parents.   Parenting Support Group Options:  Bereavement Grief Support Group (Pregnancy/Infant Loss) - Virtual This is an ongoing experience that meets once a month and is designed to help you honor the past, assist you in discovering tools to strengthen you today, and aid you in developing hope for the future.  Breastfeeding & Pumping Support Group (In-Person on Thursdays at 12pm or Virtual on Tuesdays at 5pm) Join [place] in-person each Thursday starting June 1st, 2023 at 12pm! This support group is free for all families looking for breastfeeding and/or pumping support.   Community-Based Childbirth Education Options:  [facility] Department Classes:  Childbirth education classes can help you get ready for a positive parenting experience. You can also meet other expectant parents and get free stuff for your baby. Each class runs for five weeks on the same night and costs $45 for the mother-to-be and her support person. Medicaid covers the cost if you are eligible. Call [phone] to register.  [facility] [employer] [facility] offers a variety of programs for the [employer] and is another great way to get connected. Please go to [email or web address] for more information.  Childbirth With A Twist! Be informed of your options, get  educated on birth, understand what your body is doing, learn how to cope, and have a lot of fun and laughs all while doing it either from the comfort of your couch OR in our cozy office and classroom space near the [place] airport. If you are taking a virtual class, then class is taught LIVE, so you can ask questions and receive answers in real-time from an experienced doula and childbirth educator.  This virtual childbirth education class will meet for five instruction times online.  Although we are based in [place], [place], this virtual class is open to anyone in the world. Please visit: [email or web address] for more information.  Books We Love: The Doula Guide to Childbirth by Harland German and Otila Back The First-Time Parent's Childbirth Handbook by Dr. Amie Critchley, CNM The Birth Partner by Truddie Crumble  Options for Doula Care in the [place] Area  As you review your birthing options, consider having a birth doula. A doula is trained to provide support before, during and just after you give birth. There are also postpartum doulas that help you adjust to new parenthood.  While doulas do not provide medical care, they do provide emotional, physical and educational support. A few months before your baby arrives, doulas can help answer questions, ease concerns and help you create and support your birthing plan.    Doulas can help reduce your stress and comfort you and your partner. They can help you cope with labor by helping you use breathing techniques, massage, creative labor positioning,  essential oils and affirmations.   Studies show that the benefits of having a doula include:   A more positive birth experience  Fewer requests for pain-relief medication  Less likelihood of cesarean section, commonly called a c-section   Doulas are typically hired via a advertising account planner between you and the doula. We are happy to provide a list of the most active doulas in  the area, all of whom are credentialed by [facility] and will not count as a visitor at your birth.  There are several options for no-cost doula care at our hospital, including:  [facility] Volunteer Doula Program Every [employer] Program A Cure 4 Moms Doula Study (available only at [employer] for Women, [place], [place] and [employer] [facility] offices)  For more information on these programs or to receive a list of doulas active in our area, please email [email or web address]

## 2023-08-04 NOTE — Progress Notes (Signed)
 Exercise and sleep recommendations per patient request.

## 2023-08-04 NOTE — Progress Notes (Signed)
 INITIAL PRENATAL VISIT  Subjective:   Ana Horton is being seen today for her first obstetrical visit. She is at [redacted]w[redacted]d gestation by LMP Her obstetrical history is significant for  hx ectopic (left tube removal) . Relationship with FOB: significant other, living together. Patient does intend to breast feed. Pregnancy history fully reviewed.  Patient reports no complaints.  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     No current facility-administered medications on file prior to visit.    No Known Allergies  Social History:  reports that she has never smoked. She has never used smokeless tobacco. She reports that she does not currently use alcohol. She reports current drug use. Drug: Marijuana.  Family History  Problem Relation Age of Onset   Hypertension Father    Thyroid disease  Mother     The following portions of the patient's history were reviewed and updated as appropriate: allergies, current medications, past family history, past medical history, past social history, past surgical history and problem list.  Review of Systems Review of Systems  All other systems reviewed and are negative.     Physical Exam:  BP 110/71   Pulse 66   Wt 152 lb 8 oz (69.2 kg)   LMP 05/14/2023   BMI 27.01 kg/m  CONSTITUTIONAL: Well-developed, well-nourished female in no acute distress.  HENT:  Normocephalic, atraumatic.  EYES: Conjunctivae normal. NECK: Normal range of motion SKIN: Skin is warm and dry. MUSCULOSKELETAL: Normal range of motion.  NEUROLOGIC: Alert and oriented  PSYCHIATRIC: Normal mood and affect. Normal behavior. Normal judgment and thought content. CARDIOVASCULAR: Normal heart rate noted RESPIRATORY: normal effort ABDOMEN: Soft PELVIC: Normal appearing external genitalia; normal appearing vaginal mucosa and cervix.  No abnormal discharge noted.  Pap smear done obtained.       Movement: Present       Assessment:    Pregnancy: G4P0030  1. History of ectopic pregnancy (Primary)  February 2023 ectopic w/ left salpingectomy, 10/06/2021 ectopic 07/02/23 u/s single IUP 2. Encounter for supervision of low-risk pregnancy in first trimester FHR and movement visualized on bedside u/s  and showed to patient and partner  - Hemoglobin A1c - Culture, OB Urine - CBC/D/Plt+RPR+Rh+ABO+RubIgG... - Cytology - PAP - Ambulatory Referral to [facility]  3. Encounter for supervision of other normal pregnancy in first trimester  - PANORAMA PRENATAL TEST  4. Encounter of female for testing for genetic disease carrier status for procreative management  - HORIZON Basic Panel  5. [redacted] weeks gestation of pregnancy Discussed recommendation for ASA during pregnancy, rx sent to start 12 weeks Dicsussed waterbirth, doula services. Information given, will meet with midwife  and instructed on class and certificate      Initial labs drawn. Prenatal vitamins. Problem list reviewed and updated. Reviewed in detail the nature of the practice with collaborative care between  Genetic screening discussed: NIPS/First trimester screen/Quad/AFP ordered. Role of ultrasound in pregnancy discussed; Anatomy US : ordered.  Follow up in 4 weeks. Discussed clinic routines, schedule of care and testing, genetic screening options, involvement of students and residents under the direct supervision of APPs and doctors and presence of female providers. Pt verbalized understanding.   Delores Nidia CROME, FNP

## 2023-08-05 DIAGNOSIS — Z8759 Personal history of other complications of pregnancy, childbirth and the puerperium: Secondary | ICD-10-CM | POA: Insufficient documentation

## 2023-08-05 LAB — CBC/D/PLT+RPR+RH+ABO+RUBIGG...
Antibody Screen: NEGATIVE
Basophils Absolute: 0 10*3/uL (ref 0.0–0.2)
Basos: 0 %
EOS (ABSOLUTE): 0 10*3/uL (ref 0.0–0.4)
Eos: 1 %
HCV Ab: NONREACTIVE
HIV Screen 4th Generation wRfx: NONREACTIVE
Hematocrit: 36 % (ref 34.0–46.6)
Hemoglobin: 11.8 g/dL (ref 11.1–15.9)
Hepatitis B Surface Ag: NEGATIVE
Immature Grans (Abs): 0 10*3/uL (ref 0.0–0.1)
Immature Granulocytes: 0 %
Lymphocytes Absolute: 2.1 10*3/uL (ref 0.7–3.1)
Lymphs: 26 %
MCH: 30.1 pg (ref 26.6–33.0)
MCHC: 32.8 g/dL (ref 31.5–35.7)
MCV: 92 fL (ref 79–97)
Monocytes Absolute: 0.6 10*3/uL (ref 0.1–0.9)
Monocytes: 7 %
Neutrophils Absolute: 5.5 10*3/uL (ref 1.4–7.0)
Neutrophils: 66 %
Platelets: 287 10*3/uL (ref 150–450)
RBC: 3.92 x10E6/uL (ref 3.77–5.28)
RDW: 13.7 % (ref 11.7–15.4)
RPR Ser Ql: NONREACTIVE
Rh Factor: POSITIVE
Rubella Antibodies, IGG: 6.23 {index} (ref >0.99)
WBC: 8.3 10*3/uL (ref 3.4–10.8)

## 2023-08-05 LAB — HCV INTERPRETATION

## 2023-08-05 LAB — HEMOGLOBIN A1C
Est. average glucose Bld gHb Est-mCnc: 97 mg/dL
Hgb A1c MFr Bld: 5 % (ref 4.8–5.6)

## 2023-08-10 LAB — CYTOLOGY - PAP
Chlamydia: NEGATIVE
Comment: NEGATIVE
Comment: NORMAL
Diagnosis: NEGATIVE
Neisseria Gonorrhea: NEGATIVE

## 2023-08-11 LAB — PANORAMA PRENATAL TEST FULL PANEL:PANORAMA TEST PLUS 5 ADDITIONAL MICRODELETIONS: FETAL FRACTION: 10.8

## 2023-08-14 LAB — HORIZON CUSTOM: REPORT SUMMARY: NEGATIVE

## 2023-08-18 ENCOUNTER — Telehealth: Payer: Self-pay | Admitting: Family Medicine

## 2023-08-18 NOTE — Telephone Encounter (Signed)
 I attempted to reach out to the patient regarding her decision to cancel her appointment. I explained that we could work out Lexicographer to accommodate her situation. Emphasized the importance of her prenatal care and expressed our concern. Kindly requested her to inform us about her plans for continued care.

## 2023-09-03 ENCOUNTER — Other Ambulatory Visit: Payer: Self-pay

## 2023-09-03 ENCOUNTER — Ambulatory Visit: Payer: Self-pay | Admitting: Certified Nurse Midwife

## 2023-09-03 ENCOUNTER — Encounter: Payer: Self-pay | Admitting: Certified Nurse Midwife

## 2023-09-03 VITALS — BP 105/66 | HR 92 | Wt 158.0 lb

## 2023-09-03 DIAGNOSIS — Z3492 Encounter for supervision of normal pregnancy, unspecified, second trimester: Secondary | ICD-10-CM | POA: Diagnosis not present

## 2023-09-03 DIAGNOSIS — Z3A16 16 weeks gestation of pregnancy: Secondary | ICD-10-CM | POA: Diagnosis not present

## 2023-09-03 NOTE — Progress Notes (Signed)
   PRENATAL VISIT NOTE  Subjective:  Ana Horton is a 23 y.o. G4P0030 at [redacted]w[redacted]d being seen today for ongoing prenatal care.  She is currently monitored for the following issues for this low-risk pregnancy and has Supervision of high risk pregnancy, antepartum and History of ectopic pregnancy on their problem list.  Patient reports no complaints.  Contractions: Not present. Vag. Bleeding: None.   . Denies leaking of fluid.   The following portions of the patient's history were reviewed and updated as appropriate: allergies, current medications, past family history, past medical history, past social history, past surgical history and problem list.   Objective:   Vitals:   09/03/23 1548  BP: 105/66  Pulse: 92  Weight: 158 lb (71.7 kg)    Fetal Status: Fetal Heart Rate (bpm): 154         General:  Alert, oriented and cooperative. Patient is in no acute distress.  Skin: Skin is warm and dry. No rash noted.   Cardiovascular: Normal heart rate noted  Respiratory: Normal respiratory effort, no problems with respiration noted  Abdomen: Soft, gravid, appropriate for gestational age.  Pain/Pressure: Absent     Pelvic: Cervical exam deferred        Extremities: Normal range of motion.  Edema: None  Mental Status: Normal mood and affect. Normal behavior. Normal judgment and thought content.   Assessment and Plan:  Pregnancy: G4P0030 at [redacted]w[redacted]d 1. Encounter for supervision of low-risk pregnancy in second trimester (Primary) - Patient doing well.  - AFP, Serum, Open Spina Bifida - Culture, OB Urine  2. [redacted] weeks gestation of pregnancy - Doula and Friend of Patient present and supportive.  - Desires WB.  - Pt interested in waterbirth and has NOT attended the class.  - Reviewed conditions in labor that will risk her out of water immersion including thick meconium or blood stained amniotic fluid, non-reassuring fetal status on monitor, excessive bleeding, hypertension, dizziness, use of IV  meds, damaged equipment or staffing that does not allow for water immersion, etc.  - The attending midwife must be on the unit for water immersion to begin; pt understands this may delay the start of water immersion. - Reminded pt that signing consent in labor at the hospital also acknowledges they will exit the tub if the attending midwife requests. - Consent given to patient for review.  Consent will be reviewed and signed at the hospital by the waterbirth provider prior to use of the tub. - Discussed other labor support options if waterbirth becomes unavailable, including position change, freedom of movement, use of birthing ball, and/or use of hydrotherapy in the shower (dependent upon medical condition/provider discretion).  - AFP, Serum, Open Spina Bifida  Preterm labor symptoms and general obstetric precautions including but not limited to vaginal bleeding, contractions, leaking of fluid and fetal movement were reviewed in detail with the patient. Please refer to After Visit Summary for other counseling recommendations.   Return in about 4 weeks (around 10/01/2023) for LOB.  Future Appointments  Date Time Provider Department Center  09/29/2023  1:15 PM [facility] NURSE [facility]  09/29/2023  1:30 PM [facility] US1 [facility]  09/29/2023  2:30 PM [facility] PROVIDER 1 [facility]  09/30/2023  3:55 PM Bernerd Limbo, CNM [facility]    Travion Ke Danella Deis) Suzie Portela, MSN, CNM  Center for [facility]  09/03/2023 4:26 PM

## 2023-09-07 LAB — URINE CULTURE, OB REFLEX

## 2023-09-07 LAB — AFP, SERUM, OPEN SPINA BIFIDA
AFP Value: 33.8 ng/mL
Gest. Age on Collection Date: 16 wk
Maternal Age At EDD: 22.1 a

## 2023-09-07 LAB — CULTURE, OB URINE

## 2023-09-29 ENCOUNTER — Ambulatory Visit: Payer: Medicaid Other | Admitting: *Deleted

## 2023-09-29 ENCOUNTER — Encounter: Payer: Self-pay | Admitting: *Deleted

## 2023-09-29 ENCOUNTER — Ambulatory Visit: Payer: Medicaid Other | Attending: Obstetrics and Gynecology

## 2023-09-29 ENCOUNTER — Ambulatory Visit (HOSPITAL_BASED_OUTPATIENT_CLINIC_OR_DEPARTMENT_OTHER): Payer: Medicaid Other | Admitting: Maternal & Fetal Medicine

## 2023-09-29 VITALS — BP 110/59 | HR 78

## 2023-09-29 DIAGNOSIS — Z8759 Personal history of other complications of pregnancy, childbirth and the puerperium: Secondary | ICD-10-CM | POA: Insufficient documentation

## 2023-09-29 DIAGNOSIS — Z3A19 19 weeks gestation of pregnancy: Secondary | ICD-10-CM

## 2023-09-29 DIAGNOSIS — O99322 Drug use complicating pregnancy, second trimester: Secondary | ICD-10-CM

## 2023-09-29 DIAGNOSIS — Z3A10 10 weeks gestation of pregnancy: Secondary | ICD-10-CM | POA: Insufficient documentation

## 2023-09-29 DIAGNOSIS — Z9079 Acquired absence of other genital organ(s): Secondary | ICD-10-CM | POA: Insufficient documentation

## 2023-09-29 DIAGNOSIS — O09292 Supervision of pregnancy with other poor reproductive or obstetric history, second trimester: Secondary | ICD-10-CM | POA: Diagnosis not present

## 2023-09-29 DIAGNOSIS — Z363 Encounter for antenatal screening for malformations: Secondary | ICD-10-CM | POA: Diagnosis not present

## 2023-09-29 DIAGNOSIS — O0990 Supervision of high risk pregnancy, unspecified, unspecified trimester: Secondary | ICD-10-CM

## 2023-09-29 DIAGNOSIS — O0991 Supervision of high risk pregnancy, unspecified, first trimester: Secondary | ICD-10-CM | POA: Insufficient documentation

## 2023-09-29 IMAGING — US US OB < 14 WEEKS - US OB TV
1 series · 14 of 28 positions shown · non-contrast
Comparison: August 10, 2021
COMPARISON: August 10, 2021

Addendum:
CLINICAL DATA: History of prior left adnexal ectopic pregnancy,
presenting with pelvic pain and vaginal bleeding.

EXAM:
OBSTETRIC <14 WK US AND TRANSVAGINAL OB US
TECHNIQUE: Both transabdominal and transvaginal ultrasound examinations were
performed for complete evaluation of the gestation as well as the
maternal uterus, adnexal regions, and pelvic cul-de-sac.
Transvaginal technique was performed to assess early pregnancy.

[Series 1: us ob < 14 weeks - us ob tv · 61 acquisitions, 14 frames shown]
[im 3/61]
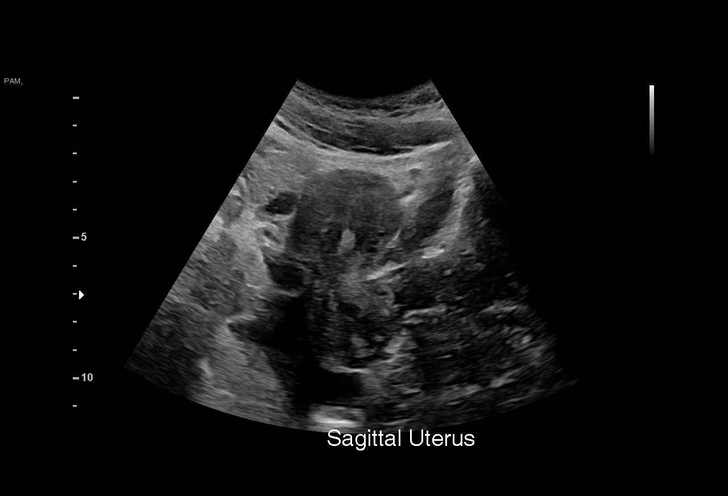
[im 7/61]
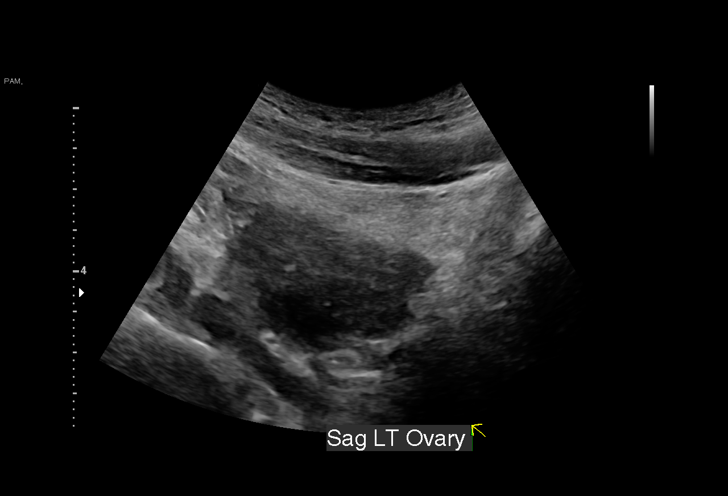
[im 12/61]
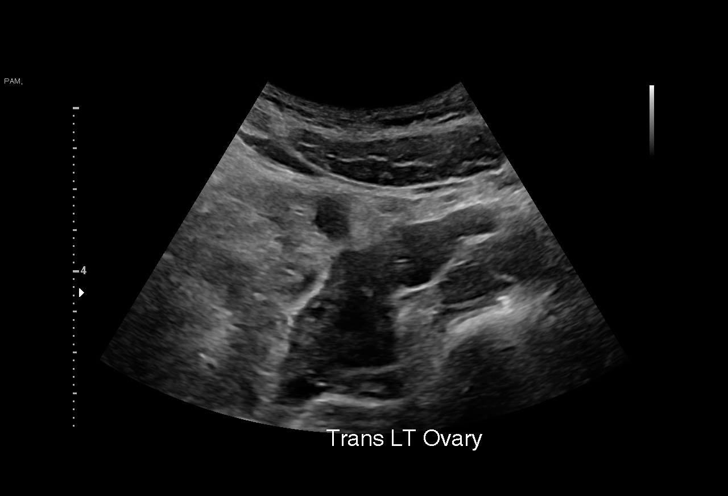
[im 16/61]
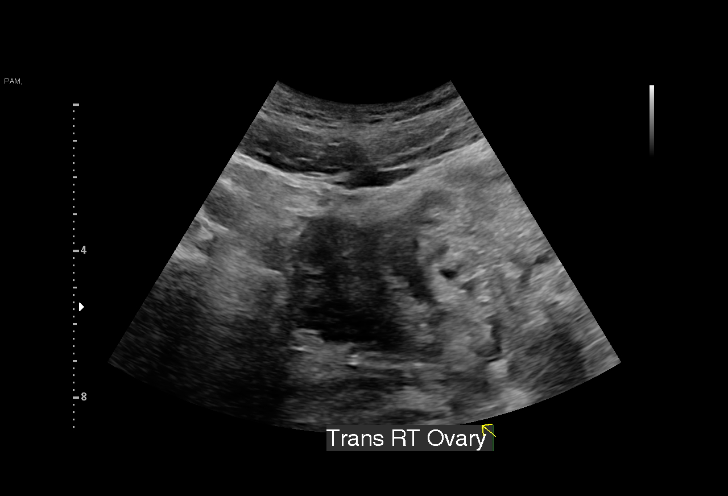
[im 21/61]
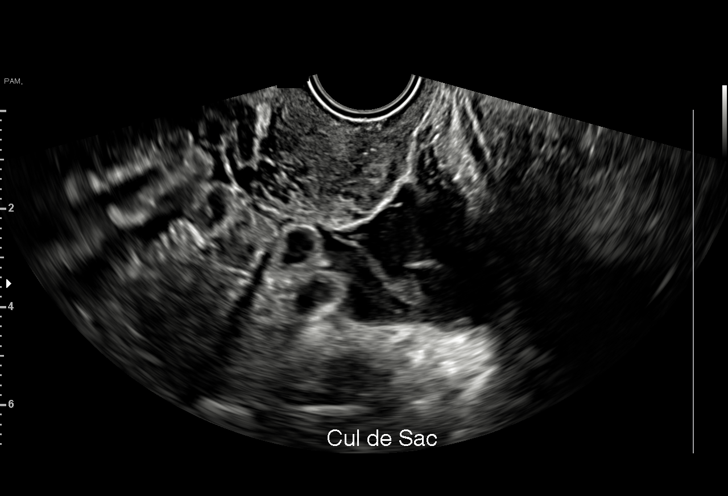
[im 25/61]
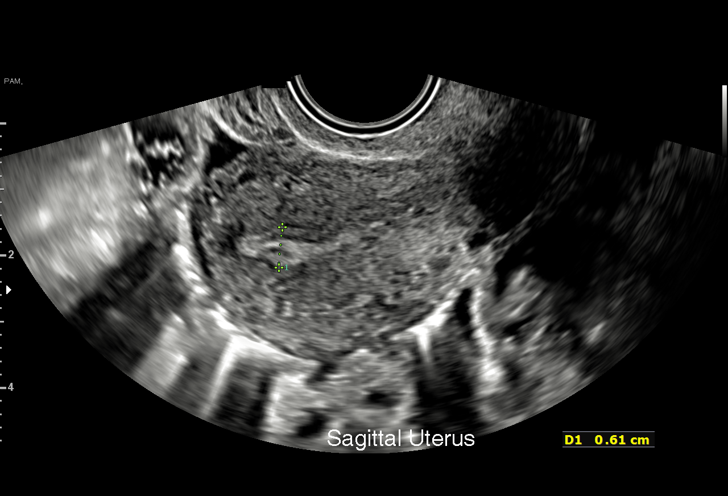
[im 29/61]
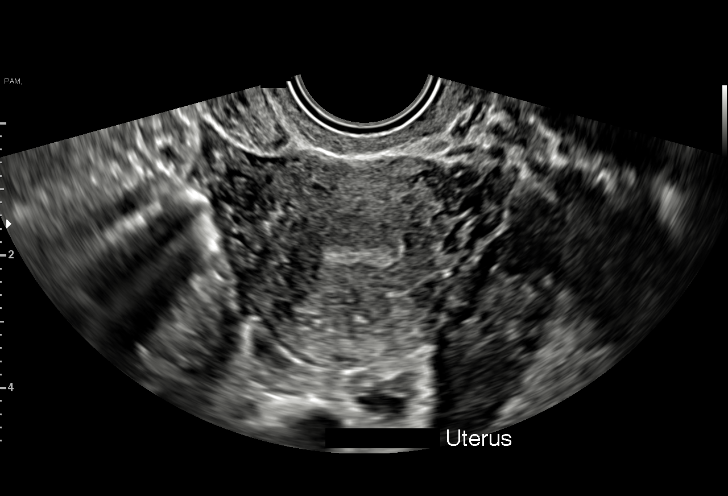
[im 34/61]
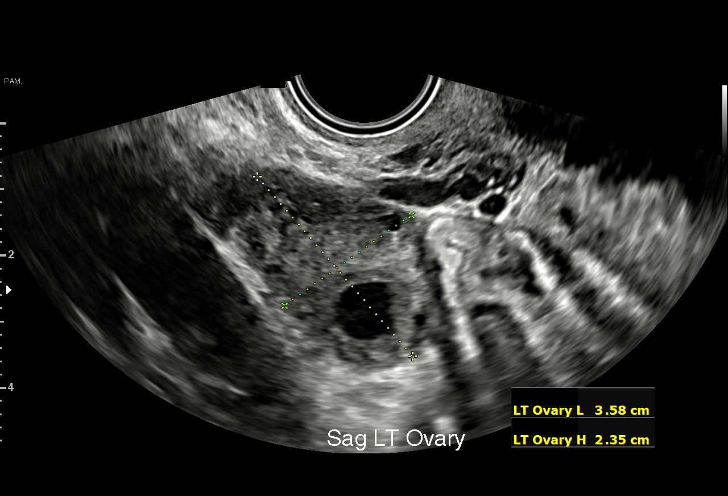
[im 38/61]
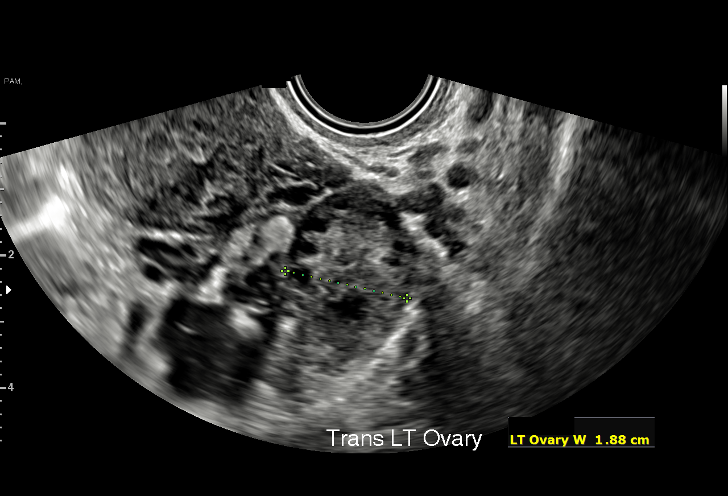
[im 43/61]
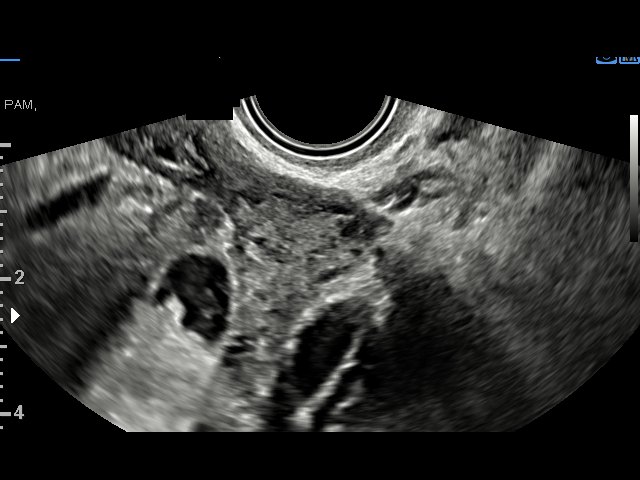
[im 47/61]
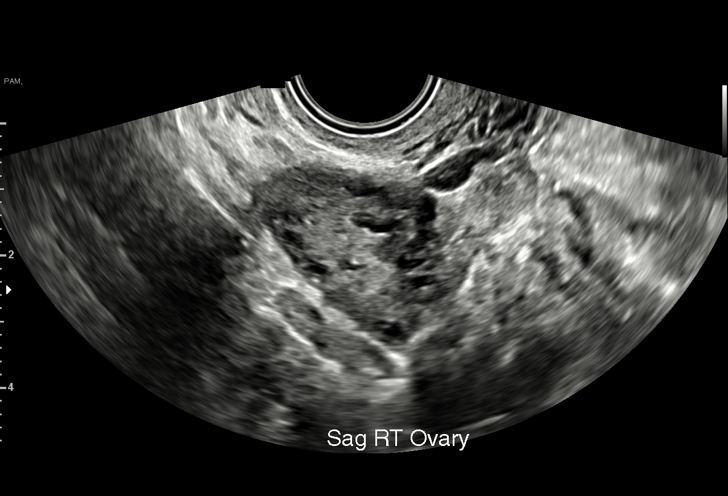
[im 52/61]
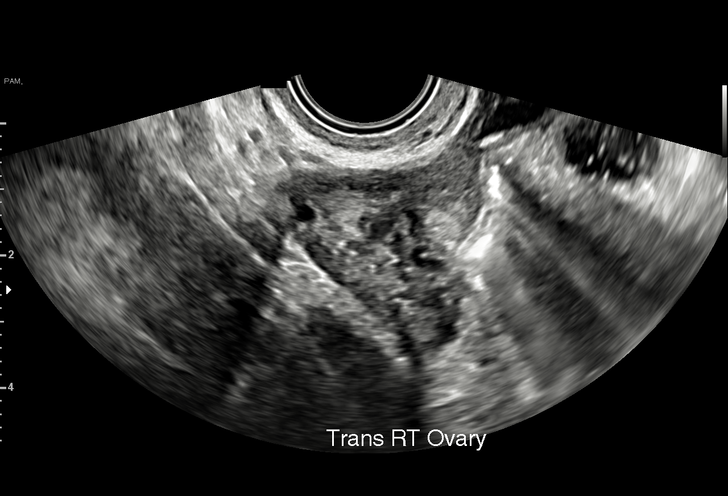
[im 56/61]
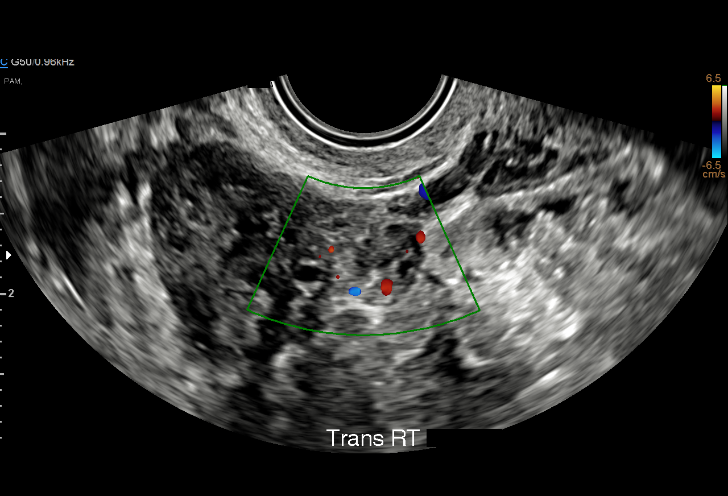
[im 61/61]
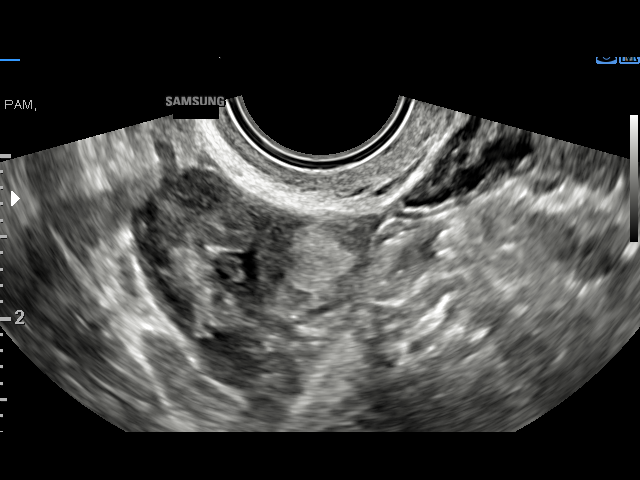

[14 of 28 positions shown; findings below may reference images not displayed]

FINDINGS: Intrauterine gestational sac: None

Yolk sac:  Not Visualized.

Embryo:  Not Visualized.

Cardiac Activity: Not Visualized.

Heart Rate: N/A  bpm

Maternal uterus/adnexae: The endometrium measures 6.1 mm in
thickness.

The right ovary measures 3.3 cm x 2.4 cm x 2.1 cm and is normal in
appearance.

A 1.6 cm x 1.2 cm heterogeneous mildly hyperechoic mass is seen
within the right adnexa. Vascularity is seen within this region on
color Doppler evaluation. This represents a new finding when
compared to the prior study.

The left ovary measures 3.6 cm x 2.4 cm x 1.9 cm and contains a
small corpus luteum cyst.

A moderate amount of pelvic free fluid, containing mildly
heterogeneous echoes, is again seen within the cul-de-sac.
IMPRESSION: 1. No evidence of an intrauterine pregnancy.
2. Vascular RIGHT adnexal mass, consistent with a RIGHT ectopic
pregnancy. Correlation with follow-up pelvic ultrasound and serial
beta HCG levels is recommended.
3. Mildly complex pelvic free fluid which is seen on the prior
study. While this is felt to less likely represent sequelae
associated with rupture of the RIGHT ectopic pregnancy, follow-up
pelvic ultrasound is recommended to confirm stability.

ADDENDUM:
Results were discussed with Dr. Galip at [DATE] a.m. Eastern on October 06, 2021.

*** End of Addendum ***
FINDINGS: Intrauterine gestational sac: None

Yolk sac:  Not Visualized.

Embryo:  Not Visualized.

Cardiac Activity: Not Visualized.

Heart Rate: N/A  bpm

Maternal uterus/adnexae: The endometrium measures 6.1 mm in
thickness.

The right ovary measures 3.3 cm x 2.4 cm x 2.1 cm and is normal in
appearance.

A 1.6 cm x 1.2 cm heterogeneous mildly hyperechoic mass is seen
within the right adnexa. Vascularity is seen within this region on
color Doppler evaluation. This represents a new finding when
compared to the prior study.

The left ovary measures 3.6 cm x 2.4 cm x 1.9 cm and contains a
small corpus luteum cyst.

A moderate amount of pelvic free fluid, containing mildly
heterogeneous echoes, is again seen within the cul-de-sac.
IMPRESSION: 1. No evidence of an intrauterine pregnancy.
2. Vascular RIGHT adnexal mass, consistent with a RIGHT ectopic
pregnancy. Correlation with follow-up pelvic ultrasound and serial
beta HCG levels is recommended.
3. Mildly complex pelvic free fluid which is seen on the prior
study. While this is felt to less likely represent sequelae
associated with rupture of the RIGHT ectopic pregnancy, follow-up
pelvic ultrasound is recommended to confirm stability.

## 2023-09-29 NOTE — Progress Notes (Signed)
 Patient information  Patient Name: Ana Horton  Patient MRN:   [id number]  Referring practice: MFM Referring Provider: [facility])  MFM CONSULT  Ana Horton is a 23 y.o. G4P0030 at [redacted]w[redacted]d here for ultrasound and consultation. Patient Active Problem List   Diagnosis Date Noted   History of ectopic pregnancy 08/05/2023   Supervision of high risk pregnancy, antepartum 07/28/2023   Ana Horton has a pregnancy with the complications mentioned in the problem list. During today's visit we focused on the following concerns:   RE hx of ectopic pregnancy x2 or 3: The patient recalls at least 2 and possibly 3 ectopic pregnancies.  She reports that the first 1 resulted in removal of her left fallopian tube.  She thinks that she had 2 other ectopic pregnancies treated with methotrexate.  Since this treatment was at least a year ago it should not affect her current pregnancy.    RE hx of THC use: The patient quit using THC immediately upon finding she was pregnant.  She denies ongoing use of other substances including tobacco.  I discussed that there is no need for future ultrasounds given the short duration of use.  Sonographic findings Single intrauterine pregnancy at 19w 5d. Fetal cardiac activity:  Observed and appears normal. Presentation: Cephalic. The anatomic structures that were well seen appear normal without evidence of soft markers. The anatomic survey is complete.  Fetal biometry shows the estimated fetal weight at the 60 percentile. Amniotic fluid: Within normal limits.  MVP: 6.91 cm. Placenta: Fundal. Adnexa: No abnormality visualized. Cervical length: 3.3 cm.  There are limitations of prenatal ultrasound such as the inability to detect certain abnormalities due to poor visualization. Various factors such as fetal position, gestational age and maternal body habitus may increase the difficulty in visualizing the fetal anatomy.     Recommendations -EDD should be 02/18/2024 based on  LMP  (05/14/23). -No further ultrasounds are recommended at this time based on the current indications. If future indications arise (e.g. size/date discrepancy on fundal height, gestational diabetes or hypertension) and an ultrasound is to be desired at our MFM office, please send a referral.   Review of Systems: A review of systems was performed and was negative except per HPI   Vitals and Physical Exam    09/29/2023    1:40 PM 09/03/2023    3:48 PM 08/04/2023    3:16 PM  Vitals with BMI  Weight  158 lbs 152 lbs 8 oz  Systolic 110 105 409  Diastolic 59 66 71  Pulse 78 92 66    Sitting comfortably on the sonogram table Nonlabored breathing Normal rate and rhythm Abdomen is nontender  Past pregnancies OB History  Gravida Para Term Preterm AB Living  4 0 0 0 3 0  SAB IAB Ectopic Multiple Live Births  1 0 2 0 0    # Outcome Date GA Lbr Len/2nd Weight Sex Type Anes PTL Lv  4 Current           3 SAB 2024          2 Ectopic 09/2021          1 Ectopic 07/2021             I spent 30 minutes reviewing the patients chart, including labs and images as well as counseling the patient about her medical conditions. Greater than 50% of the time was spent in direct face-to-face patient counseling.  [employer],  DO Maternal fetal medicine, [place]   09/29/2023  2:28 PM

## 2023-09-30 ENCOUNTER — Encounter: Admitting: Certified Nurse Midwife

## 2023-10-07 ENCOUNTER — Encounter: Admitting: Certified Nurse Midwife

## 2023-10-07 NOTE — Progress Notes (Deleted)
   PRENATAL VISIT NOTE  Subjective:  Ana Horton is a 23 y.o. G4P0030 at [redacted]w[redacted]d being seen today for ongoing prenatal care.  She is currently monitored for the following issues for this {Blank single:19197::"high-risk","low-risk"} pregnancy and has Supervision of high risk pregnancy, antepartum and History of ectopic pregnancy on their problem list.  Patient reports {sx:14538}.   .  .   . Denies leaking of fluid.   The following portions of the patient's history were reviewed and updated as appropriate: allergies, current medications, past family history, past medical history, past social history, past surgical history and problem list.   Objective:  There were no vitals filed for this visit.  Fetal Status:           General:  Alert, oriented and cooperative. Patient is in no acute distress.  Skin: Skin is warm and dry. No rash noted.   Cardiovascular: Normal heart rate noted  Respiratory: Normal respiratory effort, no problems with respiration noted  Abdomen: Soft, gravid, appropriate for gestational age.        Pelvic: {Blank single:19197::"Cervical exam performed in the presence of a chaperone","Cervical exam deferred"}        Extremities: Normal range of motion.     Mental Status: Normal mood and affect. Normal behavior. Normal judgment and thought content.   Assessment and Plan:  Pregnancy: G4P0030 at [redacted]w[redacted]d 1. Encounter for supervision of low-risk pregnancy in second trimester (Primary) ***  2. [redacted] weeks gestation of pregnancy ***  {Blank single:19197::"Term","Preterm"} labor symptoms and general obstetric precautions including but not limited to vaginal bleeding, contractions, leaking of fluid and fetal movement were reviewed in detail with the patient. Please refer to After Visit Summary for other counseling recommendations.   No follow-ups on file.  Future Appointments  Date Time Provider Department Center  10/07/2023  4:15 PM Osborne Oman [facility]     Elige Ko, [place]

## 2023-10-21 ENCOUNTER — Other Ambulatory Visit: Payer: Self-pay

## 2023-10-21 ENCOUNTER — Ambulatory Visit: Admitting: Certified Nurse Midwife

## 2023-10-21 VITALS — BP 104/63 | HR 75 | Wt 165.3 lb

## 2023-10-21 DIAGNOSIS — Z3492 Encounter for supervision of normal pregnancy, unspecified, second trimester: Secondary | ICD-10-CM | POA: Diagnosis not present

## 2023-10-21 DIAGNOSIS — Z3A22 22 weeks gestation of pregnancy: Secondary | ICD-10-CM

## 2023-10-21 DIAGNOSIS — O0992 Supervision of high risk pregnancy, unspecified, second trimester: Secondary | ICD-10-CM

## 2023-10-21 NOTE — Patient Instructions (Addendum)
 [facility]: Joeseph Amor, CNM [email or web address]  [facility]: Beckey Downing, CNM [email or web address] [phone]  [facility]: Cortney Long, CPM [email or web address] [phone]  [facility] [employer] [facility]: Prescott Parma, CNM [email or web address] [phone]

## 2023-10-21 NOTE — Progress Notes (Signed)
   PRENATAL VISIT NOTE  Subjective:  Ana Horton is a 23 y.o. G4P0030 at [redacted]w[redacted]d being seen today for ongoing prenatal care.  She is currently monitored for the following issues for this low-risk pregnancy and has Supervision of high-risk pregnancy; History of ectopic pregnancy; and Encounter for supervision of low-risk pregnancy in second trimester on their problem list.  Patient reports no complaints.  Contractions: Not present. Vag. Bleeding: None.  Movement: Present. Denies leaking of fluid.   The following portions of the patient's history were reviewed and updated as appropriate: allergies, current medications, past family history, past medical history, past social history, past surgical history and problem list.   Objective:   Vitals:   10/21/23 1004  BP: 104/63  Pulse: 75  Weight: 75 kg    Fetal Status: Fetal Heart Rate (bpm): 145 Fundal Height: 23 cm Movement: Present     General:  Alert, oriented and cooperative. Patient is in no acute distress.  Skin: Skin is warm and dry. No rash noted.   Cardiovascular: Normal heart rate noted  Respiratory: Normal respiratory effort, no problems with respiration noted  Abdomen: Soft, gravid, appropriate for gestational age.  Pain/Pressure: Absent     Pelvic: Cervical exam deferred        Extremities: Normal range of motion.  Edema: None  Mental Status: Normal mood and affect. Normal behavior. Normal judgment and thought content.   Assessment and Plan:  Pregnancy: G4P0030 at [redacted]w[redacted]d 1. Encounter for supervision of low-risk pregnancy in second trimester (Primary) - Doing well overall. Feels vigorous fetal movement - Planning water birth but had questions about homebirth. Discussed parallel care and list of midwives provided.   2. [redacted] weeks gestation of pregnancy - Routine OB Care - Declined AFP redraw today. - GTT at next appointment   Preterm labor symptoms and general obstetric precautions including but not limited to vaginal  bleeding, contractions, leaking of fluid and fetal movement were reviewed in detail with the patient. Please refer to After Visit Summary for other counseling recommendations.   Return in about 4 weeks (around 11/18/2023) for IN-PERSON, LOB/GTT.  Future Appointments  Date Time Provider Department Center  11/18/2023  8:20 AM [facility]  11/18/2023  9:35 AM Derick Fleeting, CNM [facility]    Wilford Hanks, Student-MidWife

## 2023-11-04 ENCOUNTER — Encounter: Admitting: Certified Nurse Midwife

## 2023-11-11 ENCOUNTER — Inpatient Hospital Stay (HOSPITAL_COMMUNITY)
Admission: AD | Admit: 2023-11-11 | Discharge: 2023-11-12 | Disposition: A | Discharge status: Home / Self Care | Attending: Obstetrics and Gynecology | Admitting: Obstetrics and Gynecology

## 2023-11-11 ENCOUNTER — Other Ambulatory Visit: Payer: Self-pay

## 2023-11-11 ENCOUNTER — Encounter (HOSPITAL_COMMUNITY): Payer: Self-pay | Admitting: Obstetrics and Gynecology

## 2023-11-11 DIAGNOSIS — Z3A25 25 weeks gestation of pregnancy: Secondary | ICD-10-CM | POA: Diagnosis not present

## 2023-11-11 DIAGNOSIS — R1030 Lower abdominal pain, unspecified: Secondary | ICD-10-CM | POA: Insufficient documentation

## 2023-11-11 DIAGNOSIS — O23592 Infection of other part of genital tract in pregnancy, second trimester: Secondary | ICD-10-CM | POA: Diagnosis not present

## 2023-11-11 DIAGNOSIS — Z113 Encounter for screening for infections with a predominantly sexual mode of transmission: Secondary | ICD-10-CM | POA: Diagnosis present

## 2023-11-11 DIAGNOSIS — B9689 Other specified bacterial agents as the cause of diseases classified elsewhere: Secondary | ICD-10-CM | POA: Diagnosis not present

## 2023-11-11 DIAGNOSIS — N760 Acute vaginitis: Secondary | ICD-10-CM

## 2023-11-11 DIAGNOSIS — O368120 Decreased fetal movements, second trimester, not applicable or unspecified: Secondary | ICD-10-CM | POA: Insufficient documentation

## 2023-11-11 DIAGNOSIS — Z3689 Encounter for other specified antenatal screening: Secondary | ICD-10-CM

## 2023-11-11 DIAGNOSIS — Z3492 Encounter for supervision of normal pregnancy, unspecified, second trimester: Secondary | ICD-10-CM

## 2023-11-11 LAB — URINALYSIS, ROUTINE W REFLEX MICROSCOPIC
Bilirubin Urine: NEGATIVE
Glucose, UA: NEGATIVE mg/dL
Hgb urine dipstick: NEGATIVE
Ketones, ur: NEGATIVE mg/dL
Leukocytes,Ua: NEGATIVE
Nitrite: NEGATIVE
Protein, ur: NEGATIVE mg/dL
Specific Gravity, Urine: 1.026 (ref 1.005–1.030)
pH: 5 (ref 5.0–8.0)

## 2023-11-11 LAB — WET PREP, GENITAL
Sperm: NONE SEEN
Trich, Wet Prep: NONE SEEN
WBC, Wet Prep HPF POC: <10 (ref <10)
Yeast Wet Prep HPF POC: NONE SEEN

## 2023-11-11 MED ORDER — METRONIDAZOLE 500 MG PO TABS: 500.0000 mg | ORAL_TABLET | Freq: Two times a day (BID) | ORAL | 0 refills | Status: DC

## 2023-11-11 NOTE — MAU Provider Note (Signed)
 History     [id number]  Arrival date and time: 11/11/23 2211    Chief Complaint  Patient presents with   Abdominal Pain   Decreased Fetal Movement     HPI Ana Horton is a 23 y.o. at [redacted]w[redacted]d by LMP c/w 6wk US  presenting for decreased fetal movement x 4 days and lower abdominal cramping.    She has experienced mild cramping and a noticeable decrease in fetal movements since Saturday. Initially, movements were strong and frequent, but they have become softer and less frequent, requiring her to pay close attention to feel any movement. She works two jobs and sometimes forgets to eat, which she thinks might contribute to the issue.  No alarming discharge is noted, but she mentions having some white, slimy, clumpy discharge without bleeding. She has felt a few leaks but nothing consistent or in large amounts. No vaginal bleeding.  She mentions a previous abnormal urine test at a med center, suggesting a possible UTI or yeast infection, but she was not treated for it. She has a sensitive pH and is prone to UTIs and yeast infections.  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   Vaginitis 11/24/2019   Past Surgical History:  Procedure Laterality Date   LAPAROSCOPIC UNILATERAL SALPINGECTOMY Left 08/10/2021   Procedure: LAPAROSCOPIC LEFT SALPINGECTOMY WITH REMOVAL OF ECTOPIC PREGNANCY;  Surgeon: Abigail Abler, MD;  Location: [facility] OR;  Service: Gynecology;  Laterality: Left;   Family History  Problem Relation Age of Onset   Hypertension Father     Thyroid disease Mother    Social History   Socioeconomic History   Marital status: Single    Spouse name: Not on file   Number of children: Not on file   Years of education: Not on file   Highest education level: Not on file  Occupational History   Not on file  Tobacco Use   Smoking status: Never   Smokeless tobacco: Never   Tobacco comments:    black and mild  Vaping Use   Vaping status: Some Days  Substance and Sexual Activity   Alcohol use: Not Currently   Drug use: Yes    Types: Marijuana   Sexual activity: Not Currently    Birth control/protection: None    Comment: nexplanon out around 2022  Other Topics Concern   Not on file  Social History Narrative   Not on file   Social Drivers of Health   Financial Resource Strain: Not on File (12/26/2021)   Received from [employer] [employer]    Financial Resource Strain: 0  Food Insecurity: Not on File (03/26/2023)   Received from [facility] Insecurity    Food: 0  Transportation Needs: No Transportation Needs (12/27/2021)   PRAPARE - Administrator, Civil Service (Medical): No    Lack of Transportation (Non-Medical): No  Physical Activity: Not on File (12/26/2021)   Received from [place], [place]   Physical Activity    Physical Activity: 0  Stress: Not  on File (12/26/2021)   Received from [facility], [place]   Stress    Stress: 0  Social Connections: Not on File (03/14/2023)   Received from [facility]   Social Connections    Connectedness: 0  Intimate Partner Violence: Not on file    No Known Allergies  No current facility-administered medications on file prior to encounter.   Current Outpatient Medications on File Prior to Encounter  Medication Sig Dispense Refill   aspirin  EC 81 MG tablet Take 1 tablet (81 mg total) by mouth daily. Start taking when you are [redacted] weeks pregnant for rest of pregnancy for prevention of preeclampsia 300 tablet 2   Prenatal Vit-Fe Fumarate-FA (PRENATAL VITAMIN)  27-0.8 MG TABS Take 1 tablet by mouth daily.       ROS Pertinent positives and negative per HPI, all others reviewed and negative  Physical Exam   BP 107/61   Pulse 75   Temp 98.7 F (37.1 C) (Oral)   Resp 18   LMP 05/14/2023 (Exact Date)   SpO2 99%   Patient Vitals for the past 24 hrs:  BP Temp Temp src Pulse Resp SpO2  11/12/23 0047 107/61 -- -- 75 -- --  11/12/23 0043 (!) 118/54 -- -- 85 -- --  11/11/23 2235 -- -- -- -- -- 99 %  11/11/23 2232 106/63 98.7 F (37.1 C) Oral 87 18 --   Gen: A&Ox3, NAD CV: Normal rate Resp: Normal effort Abd: Soft, gravid, non tender SSE: Normal vaginal mucosa. Thin white physiologic discharge. Cervix closed and visually normal, no bleeding.   FHT Baseline 140bpm, moderate variability, 10 x10s, no decels Toco: flat Clicked button for fetal movement frequently throughout MAU course Appropriate for gestational age  Labs Results for orders placed or performed during the hospital encounter of 11/11/23 (from the past 24 hours)  Urinalysis, Routine w reflex microscopic -Urine, Clean Catch     Status: Abnormal   Collection Time: 11/11/23 10:23 PM  Result Value Ref Range   Color, Urine YELLOW YELLOW   APPearance HAZY (A) CLEAR   Specific Gravity, Urine 1.026 1.005 - 1.030   pH 5.0 5.0 - 8.0   Glucose, UA NEGATIVE NEGATIVE mg/dL   Hgb urine dipstick NEGATIVE NEGATIVE   Bilirubin Urine NEGATIVE NEGATIVE   Ketones, ur NEGATIVE NEGATIVE mg/dL   Protein, ur NEGATIVE NEGATIVE mg/dL   Nitrite NEGATIVE NEGATIVE   Leukocytes,Ua NEGATIVE NEGATIVE  Wet prep, genital     Status: Abnormal   Collection Time: 11/11/23 10:59 PM   Specimen: Vaginal  Result Value Ref Range   Yeast Wet Prep HPF POC NONE SEEN NONE SEEN   Trich, Wet Prep NONE SEEN NONE SEEN   Clue Cells Wet Prep HPF POC PRESENT (A) NONE SEEN   WBC, Wet Prep HPF POC <10 <10   Sperm NONE SEEN    Imaging No results found.  MAU Course  Procedures Lab Orders         Wet prep, genital          Urinalysis, Routine w reflex microscopic -Urine, Clean Catch    Meds ordered this encounter  Medications   metroNIDAZOLE  (FLAGYL ) 500 MG tablet    Sig: Take 1 tablet (500 mg total) by mouth 2 (two) times daily for 7 days.    Dispense:  14 tablet    Refill:  0   Imaging Orders  No imaging studies ordered today   MDM moderate  Assessment and Plan  22yo 09/28 at [redacted]w[redacted]d with decreased fetal  movement, lower abdominal cramping, and vaginal discharge  DFM - NST appropriate for gestational age - Feeling fetal movement in MAU  2. Cramping/discharge - UA w/out evidence of UTI, wet prep c/w BV  - flagyl  sent - No e/o PTL, cervix visually closed, no contractions on toco  Dispo: home with strict return precautions. Follow up as scheduled on 5/21  Discharge Instructions     Activity as tolerated - No restrictions   Complete by: As directed    Call MD for:   Complete by: As directed    Worsening contractions, vaginal bleeding, leaking fluid, or baby not moving normally   Diet general   Complete by: As directed    Discharge patient   Complete by: As directed    Discharge disposition: 01-Home or Self Care   Discharge patient date: 11/12/2023     Dispo: anticipate discharge home with strict return precautions and ROB as scheduled on 5/21  Izell Marsh, MD 11/12/23 8:37 AM  Allergies as of 11/12/2023   No Known Allergies      Medication List     STOP taking these medications    multivitamin with minerals Tabs tablet       TAKE these medications    aspirin  EC 81 MG tablet Take 1 tablet (81 mg total) by mouth daily. Start taking when you are [redacted] weeks pregnant for rest of pregnancy for prevention of preeclampsia   metroNIDAZOLE  500 MG tablet Commonly known as: FLAGYL  Take 1 tablet (500 mg total) by mouth 2 (two) times daily for 7 days.   Prenatal Vitamin 27-0.8 MG Tabs Take 1 tablet by mouth daily.

## 2023-11-11 NOTE — MAU Note (Signed)
..  Ana Horton is a 23 y.o. at [redacted]w[redacted]d here in MAU reporting: decreased fetal movement since Saturday, felt movement once EFM applied. cramping for a week. Reports she has not had to take anything for the cramping. Denies vaginal bleeding or leaking of fluid.   Pain score: 5/10 Vitals:   11/11/23 2232 11/11/23 2235  BP: 106/63   Pulse: 87   Resp: 18   Temp: 98.7 F (37.1 C)   SpO2:  99%     FHT:150 Lab orders placed from triage:  UA

## 2023-11-12 ENCOUNTER — Ambulatory Visit: Payer: Self-pay | Admitting: Obstetrics and Gynecology

## 2023-11-12 LAB — GC/CHLAMYDIA PROBE AMP (~~LOC~~) NOT AT ARMC
Chlamydia: NEGATIVE
Comment: NEGATIVE
Comment: NORMAL
Neisseria Gonorrhea: NEGATIVE

## 2023-11-18 ENCOUNTER — Other Ambulatory Visit

## 2023-11-18 ENCOUNTER — Ambulatory Visit: Admitting: Certified Nurse Midwife

## 2023-11-18 VITALS — BP 102/65 | HR 83 | Wt 173.0 lb

## 2023-11-18 DIAGNOSIS — Z3A26 26 weeks gestation of pregnancy: Secondary | ICD-10-CM | POA: Diagnosis not present

## 2023-11-18 DIAGNOSIS — Z3492 Encounter for supervision of normal pregnancy, unspecified, second trimester: Secondary | ICD-10-CM

## 2023-11-18 NOTE — Progress Notes (Addendum)
   PRENATAL VISIT NOTE  Subjective:  Ana Horton is a 23 y.o. G4P0030 at [redacted]w[redacted]d being seen today for ongoing prenatal care.  She is currently monitored for the following issues for this high-risk pregnancy and has Supervision of high-risk pregnancy; History of ectopic pregnancy; and Encounter for supervision of low-risk pregnancy in second trimester on their problem list.  Patient reports going to MAU for DFM. She noticed that her baby was more active after she ate, and has been feeling her move well since. She has no other concerns at this time.  Contractions: Not present. Vag. Bleeding: None.  Movement: Present. Denies leaking of fluid.   The following portions of the patient's history were reviewed and updated as appropriate: allergies, current medications, past family history, past medical history, past social history, past surgical history and problem list.   Objective:    Vitals:   11/18/23 0932  BP: 102/65  Pulse: 83  Weight: 78.5 kg    Fetal Status:  Fetal Heart Rate (bpm): 155   Movement: Present    General: Alert, oriented and cooperative. Patient is in no acute distress.  Skin: Skin is warm and dry. No rash noted.   Cardiovascular: Normal heart rate noted  Respiratory: Normal respiratory effort, no problems with respiration noted  Abdomen: Soft, gravid, appropriate for gestational age.  Pain/Pressure: Absent     Pelvic: Cervical exam deferred        Extremities: Normal range of motion.  Edema: None  Mental Status: Normal mood and affect. Normal behavior. Normal judgment and thought content.   Assessment and Plan:  Pregnancy: G4P0030 at [redacted]w[redacted]d 1. Encounter for supervision of low-risk pregnancy in second trimester (Primary) - Doing well overall. +FM.  - Discussed with her that her baby needs food for energy, and encouraged her to eat 3 meals daily with healthy snacks in between. The goal is to eat something every 3 hours.   2. [redacted] weeks gestation of pregnancy -  Hemoglobin A1c - Glucose Tolerance, 1 Hour - RPR - HIV Antibody (routine testing w rflx) - CBC  Preterm labor symptoms and general obstetric precautions including but not limited to vaginal bleeding, contractions, leaking of fluid and fetal movement were reviewed in detail with the patient. Please refer to After Visit Summary for other counseling recommendations.     Future Appointments  Date Time Provider Department Center  12/16/2023 11:15 AM Cleophas Dadds [facility]  12/30/2023 10:55 AM Derick Fleeting, CNM [facility]    Wilford Hanks, Student-MidWife

## 2023-11-20 LAB — HEMOGLOBIN A1C
Est. average glucose Bld gHb Est-mCnc: 94 mg/dL
Hgb A1c MFr Bld: 4.9 % (ref 4.8–5.6)

## 2023-11-20 LAB — CBC
Hematocrit: 31.4 % — ABNORMAL LOW (ref 34.0–46.6)
Hemoglobin: 10.2 g/dL — ABNORMAL LOW (ref 11.1–15.9)
MCH: 30.3 pg (ref 26.6–33.0)
MCHC: 32.5 g/dL (ref 31.5–35.7)
MCV: 93 fL (ref 79–97)
Platelets: 248 10*3/uL (ref 150–450)
RBC: 3.37 x10E6/uL — ABNORMAL LOW (ref 3.77–5.28)
RDW: 13.3 % (ref 11.7–15.4)
WBC: 8.4 10*3/uL (ref 3.4–10.8)

## 2023-11-20 LAB — GLUCOSE TOLERANCE, 1 HOUR: Glucose, 1Hr PP: 98 mg/dL (ref 70–199)

## 2023-11-20 LAB — HIV ANTIBODY (ROUTINE TESTING W REFLEX): HIV Screen 4th Generation wRfx: NONREACTIVE

## 2023-11-20 LAB — RPR: RPR Ser Ql: NONREACTIVE

## 2023-11-21 ENCOUNTER — Ambulatory Visit: Payer: Self-pay | Admitting: Certified Nurse Midwife

## 2023-11-21 DIAGNOSIS — D508 Other iron deficiency anemias: Secondary | ICD-10-CM | POA: Insufficient documentation

## 2023-11-21 MED ORDER — FERROUS GLUCONATE 324 (38 FE) MG PO TABS: 324.0000 mg | ORAL_TABLET | Freq: Every day | ORAL | 3 refills | Status: DC

## 2023-12-16 ENCOUNTER — Encounter: Admitting: Certified Nurse Midwife

## 2023-12-30 ENCOUNTER — Ambulatory Visit (INDEPENDENT_AMBULATORY_CARE_PROVIDER_SITE_OTHER): Admitting: Certified Nurse Midwife

## 2023-12-30 ENCOUNTER — Other Ambulatory Visit: Payer: Self-pay

## 2023-12-30 VITALS — BP 100/63 | HR 97 | Wt 174.6 lb

## 2023-12-30 DIAGNOSIS — R300 Dysuria: Secondary | ICD-10-CM

## 2023-12-30 DIAGNOSIS — O99013 Anemia complicating pregnancy, third trimester: Secondary | ICD-10-CM | POA: Diagnosis not present

## 2023-12-30 DIAGNOSIS — Z3A32 32 weeks gestation of pregnancy: Secondary | ICD-10-CM

## 2023-12-30 DIAGNOSIS — D508 Other iron deficiency anemias: Secondary | ICD-10-CM | POA: Diagnosis not present

## 2023-12-30 DIAGNOSIS — O23593 Infection of other part of genital tract in pregnancy, third trimester: Secondary | ICD-10-CM | POA: Diagnosis not present

## 2023-12-30 DIAGNOSIS — Z3492 Encounter for supervision of normal pregnancy, unspecified, second trimester: Secondary | ICD-10-CM

## 2023-12-30 DIAGNOSIS — R3915 Urgency of urination: Secondary | ICD-10-CM

## 2023-12-30 LAB — POCT URINALYSIS DIP (DEVICE)
Bilirubin Urine: NEGATIVE
Glucose, UA: NEGATIVE mg/dL
Hgb urine dipstick: NEGATIVE
Ketones, ur: NEGATIVE mg/dL
Leukocytes,Ua: NEGATIVE
Nitrite: NEGATIVE
Protein, ur: 30 mg/dL — AB
Specific Gravity, Urine: >=1.03 (ref 1.005–1.030)
Urobilinogen, UA: 0.2 mg/dL (ref 0.0–1.0)
pH: 6.5 (ref 5.0–8.0)

## 2023-12-30 NOTE — Progress Notes (Signed)
   PRENATAL VISIT NOTE  Subjective:  Ana Horton is a 23 y.o. G4P0030 at [redacted]w[redacted]d being seen today for ongoing prenatal care.  She is currently monitored for the following issues for this low-risk pregnancy and has Supervision of low-risk pregnancy; History of ectopic pregnancy; and Iron deficiency anemia secondary to inadequate dietary iron intake on their problem list.  Patient reports increased urgency, frequency of urination as well as burning/irritation with urine. Feels similar to other UTIs, dissimilar to times she's had yeast or BV.  Contractions: Not present. Vag. Bleeding: None.  Movement: Present. Denies leaking of fluid.   The following portions of the patient's history were reviewed and updated as appropriate: allergies, current medications, past family history, past medical history, past social history, past surgical history and problem list.   Objective:   Vitals:   12/30/23 1113  BP: 100/63  Pulse: 97  Weight: 174 lb 9.6 oz (79.2 kg)   Fetal Status:  Fetal Heart Rate (bpm): 146 Fundal Height: 32 cm Movement: Present Presentation: Vertex  General: Alert, oriented and cooperative. Patient is in no acute distress.  Skin: Skin is warm and dry. No rash noted.   Cardiovascular: Normal heart rate noted  Respiratory: Normal respiratory effort, no problems with respiration noted  Abdomen: Soft, gravid, appropriate for gestational age.  Pain/Pressure: Present (pressure in pelvis)     Pelvic: Cervical exam deferred        Extremities: Normal range of motion.  Edema: None  Mental Status: Normal mood and affect. Normal behavior. Normal judgment and thought content.   Labs: UA normal except trace protein  Assessment and Plan:  Pregnancy: G4P0030 at [redacted]w[redacted]d 1. Encounter for supervision of low-risk pregnancy in second trimester (Primary) - Doing well, feeling regular and vigorous fetal movement   2. [redacted] weeks gestation of pregnancy - Routine OB care   3. Iron deficiency anemia  secondary to inadequate dietary iron intake - Taking oral iron  4. Urgency of urination - Culture, OB Urine  5. Burning with urination - Culture, OB Urine  Preterm labor symptoms and general obstetric precautions including but not limited to vaginal bleeding, contractions, leaking of fluid and fetal movement were reviewed in detail with the patient. Please refer to After Visit Summary for other counseling recommendations.   Return in about 2 weeks (around 01/13/2024) for IN-PERSON, LOB.  Future Appointments  Date Time Provider Department Center  01/13/2024  3:55 PM Vannie Cornell SAUNDERS, CNM [facility]   Cornell SAUNDERS Vannie, CNM

## 2024-01-01 ENCOUNTER — Other Ambulatory Visit: Payer: Self-pay | Admitting: Obstetrics and Gynecology

## 2024-01-08 ENCOUNTER — Encounter (HOSPITAL_COMMUNITY): Payer: Self-pay | Admitting: Obstetrics and Gynecology

## 2024-01-08 ENCOUNTER — Inpatient Hospital Stay (HOSPITAL_COMMUNITY)
Admission: AD | Admit: 2024-01-08 | Discharge: 2024-01-08 | Disposition: A | Discharge status: Home / Self Care | Attending: Obstetrics and Gynecology | Admitting: Obstetrics and Gynecology

## 2024-01-08 DIAGNOSIS — R0602 Shortness of breath: Secondary | ICD-10-CM | POA: Diagnosis present

## 2024-01-08 DIAGNOSIS — R42 Dizziness and giddiness: Secondary | ICD-10-CM | POA: Diagnosis present

## 2024-01-08 DIAGNOSIS — O26893 Other specified pregnancy related conditions, third trimester: Secondary | ICD-10-CM

## 2024-01-08 DIAGNOSIS — R519 Headache, unspecified: Secondary | ICD-10-CM | POA: Diagnosis present

## 2024-01-08 DIAGNOSIS — Z3A34 34 weeks gestation of pregnancy: Secondary | ICD-10-CM

## 2024-01-08 DIAGNOSIS — R030 Elevated blood-pressure reading, without diagnosis of hypertension: Secondary | ICD-10-CM | POA: Diagnosis not present

## 2024-01-08 LAB — URINALYSIS, ROUTINE W REFLEX MICROSCOPIC
Bilirubin Urine: NEGATIVE
Glucose, UA: NEGATIVE mg/dL
Hgb urine dipstick: NEGATIVE
Ketones, ur: NEGATIVE mg/dL
Leukocytes,Ua: NEGATIVE
Nitrite: NEGATIVE
Protein, ur: NEGATIVE mg/dL
Specific Gravity, Urine: 1.01 (ref 1.005–1.030)
pH: 7 (ref 5.0–8.0)

## 2024-01-08 MED ORDER — ONDANSETRON HCL 4 MG PO TABS: 4.0000 mg | ORAL_TABLET | Freq: Three times a day (TID) | ORAL | 0 refills | Status: DC | PRN

## 2024-01-08 NOTE — MAU Provider Note (Signed)
 Chief Complaint:  Hypertension and Headache   Event Date/Time   First Provider Initiated Contact with Patient 01/08/24 2132      HPI: Ana Horton is a 23 y.o. G4P0030 at [redacted]w[redacted]d who presents to maternity admissions reporting a headache this morning and new onset nausea.  She had court this morning and didn't each much breakfast which is unusual for her. She came home, took a nap, and woke up and had something to eat. The headache was gone at that time. She took her BP at home and it was 150s/90s x 2 so she came in. She denies h/a, epigastric pain, or visual disturbances.    She reports good fetal movement.  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   Vaginitis 11/24/2019    Past obstetric history: OB History  Gravida Para Term Preterm AB Living  4 0 0 0 3 0  SAB IAB Ectopic Multiple Live Births  1 0 2 0 0    # Outcome Date GA Lbr Len/2nd Weight Sex Type Anes PTL Lv  4 Current           3 SAB 2024          2 Ectopic 09/2021          1 Ectopic 07/2021            Past Surgical History: Past Surgical History:  Procedure Laterality Date   LAPAROSCOPIC UNILATERAL SALPINGECTOMY Left 08/10/2021   Procedure: LAPAROSCOPIC LEFT SALPINGECTOMY WITH REMOVAL OF ECTOPIC PREGNANCY;  Surgeon: Zina Jerilynn LABOR, MD;  Location: [facility] OR;  Service: Gynecology;  Laterality: Left;    Family History: Family History  Problem Relation Age of Onset   Hypertension Father    Thyroid disease Mother     Social History: Social History   Tobacco Use   Smoking status: Never   Smokeless tobacco: Never   Tobacco comments:    black and mild  Vaping Use   Vaping status:  Former  Substance Use Topics   Alcohol use: Not Currently   Drug use: Yes    Types: Marijuana    Comment: pt states its been a few weeks since last consumed    Allergies: No Known Allergies  Meds:  Medications Prior to Admission  Medication Sig Dispense Refill Last Dose/Taking   Prenatal Vit-Fe Fumarate-FA (PRENATAL VITAMIN) 27-0.8 MG TABS Take 1 tablet by mouth daily.   01/07/2024   aspirin  EC 81 MG tablet Take 1 tablet (81 mg total) by mouth daily. Start taking when you are [redacted] weeks pregnant for rest of pregnancy for prevention of preeclampsia 300 tablet 2    ferrous gluconate  (FERGON) 324 MG tablet Take 1 tablet (324 mg total) by mouth daily with breakfast. (Patient not taking: Reported on 12/30/2023) 30 tablet 3    metroNIDAZOLE  (FLAGYL ) 500 MG tablet Take 1 tablet by mouth twice daily for 7 days 14 tablet 0     ROS:  Review of Systems   I have reviewed patient's Past Medical Hx, Surgical Hx, Family Hx, Social Hx, medications and allergies.   Physical Exam  Patient Vitals for the past 24 hrs:  BP Temp Temp src Pulse Resp SpO2 Height Weight  01/08/24 2155 -- -- -- -- -- 97 % -- --  01/08/24 2150 -- -- -- -- -- 96 % -- --  01/08/24 2145 111/63 -- -- 94 -- 96 % -- --  01/08/24 2140 -- -- -- -- -- 98 % -- --  01/08/24 2135 -- -- -- -- -- 97 % -- --  01/08/24 2130 114/65 -- -- 85 -- 97 % -- --  01/08/24 2125 -- -- -- -- -- 96 % -- --  01/08/24 2120 -- -- -- -- -- 96 % -- --  01/08/24 2115 (!) 107/57 -- -- 90 -- 97 % -- --  01/08/24 2110 -- -- -- -- -- 96 % -- --  01/08/24 2105 110/64 -- -- 88 -- -- -- --  01/08/24 2045 (!) 111/57 98.3 F (36.8 C) Oral 84 16 100 % 5' 4 (1.626 m) 80.6 kg   Constitutional: Well-developed, well-nourished female in no acute distress.  Cardiovascular: normal rate Respiratory: normal effort GI: Abd soft, non-tender, gravid appropriate for gestational age.  MS: Extremities nontender, no edema, normal ROM Neurologic: Alert and oriented x 4.  GU:  Neg CVAT.  PELVIC EXAM: Cervix pink, visually closed, without lesion, scant white creamy discharge, vaginal walls and external genitalia normal Bimanual exam: Cervix 0/long/high, firm, anterior, neg CMT, uterus nontender, nonenlarged, adnexa without tenderness, enlargement, or mass     FHT:  Baseline 140 , moderate variability, accelerations present, no decelerations Contractions: irritability, mild to palpation   Labs: Results for orders placed or performed during the hospital encounter of 01/08/24 (from the past 24 hours)  Urinalysis, Routine w reflex microscopic -Urine, Clean Catch     Status: Abnormal   Collection Time: 01/08/24  8:48 PM  Result Value Ref Range   Color, Urine YELLOW YELLOW   APPearance HAZY (A) CLEAR   Specific Gravity, Urine 1.010 1.005 - 1.030   pH 7.0 5.0 - 8.0   Glucose, UA NEGATIVE NEGATIVE mg/dL   Hgb urine dipstick NEGATIVE NEGATIVE   Bilirubin Urine NEGATIVE NEGATIVE   Ketones, ur NEGATIVE NEGATIVE mg/dL   Protein, ur NEGATIVE NEGATIVE mg/dL   Nitrite NEGATIVE NEGATIVE   Leukocytes,Ua NEGATIVE NEGATIVE   A/Positive/-- (02/04 1626)  Imaging:  No results found.  MAU Course/MDM: Orders Placed This Encounter  Procedures   Urinalysis, Routine w reflex microscopic -Urine, Clean Catch   Discharge patient Discharge disposition: 01-Home or Self Care; Discharge patient date: 01/08/2024    Meds ordered this encounter  Medications   ondansetron  (ZOFRAN ) 4 MG tablet    Sig: Take 1 tablet (4 mg total) by mouth every 8 (eight) hours as needed for nausea or vomiting.    Dispense:  20 tablet    Refill:  0    Supervising Provider:   ERIK KIETH BROCKS [[id number]]     NST reviewed and reactive BP grossly normal in MAU x 2 No other obstetric concerns D/C home with PEC precautions Zofran  Rx for nausea Pt to keep appts with CNM in office as scheduled  Assessment: 1. Elevated blood-pressure reading without diagnosis of hypertension   2. [redacted] weeks gestation  of pregnancy     Plan: Discharge home Labor precautions and fetal kick counts  Follow-up Information     Vannie Matar R, CNM Follow up.   Specialty: Certified Nurse Midwife Why: As scheduled Contact information: [address] [place] [address] [phone]         [facility] Follow up.   Specialty: Obstetrics and Gynecology Why: As needed for signs of labor or emergencies Contact information: [address] [place]   [phone]               Allergies as of 01/08/2024   No Known Allergies      Medication List     TAKE these medications    aspirin  EC 81 MG tablet Take 1 tablet (81 mg total) by mouth daily. Start taking when you are [redacted] weeks pregnant for rest of pregnancy for prevention of preeclampsia   ferrous gluconate  324 MG tablet Commonly known as: FERGON Take 1 tablet (324 mg total) by mouth daily with breakfast.   metroNIDAZOLE  500 MG tablet Commonly known as: FLAGYL  Take 1 tablet by mouth twice daily for 7 days   ondansetron  4 MG tablet Commonly known as: Zofran  Take 1 tablet (4 mg total) by mouth every 8 (eight) hours as needed for nausea or vomiting.   Prenatal Vitamin 27-0.8 MG Tabs Take 1 tablet by mouth daily.        Olam Boards Certified Nurse-Midwife 01/08/2024 10:03 PM

## 2024-01-08 NOTE — MAU Note (Signed)
 MAU Triage Note:  .Ana Horton is a 23 y.o. at [redacted]w[redacted]d here in MAU reporting: felt lightheaded, SOB at rest, and headache, so she laid down for a nap. When she woke up she tried eating something and then checked her BP and it was 150's/120's. Denies VB or watery LOF. Reports +FM.  PIH Assessment: Headache present: Yes ;No treatment attempted Visual disturbances: None RUQ pain/Epigastric: None Atypical edema: None Hx of HBP: Patient denies BP Medications: bASA  Patient complaint: High bp  Pain Score: 4  Pain Location: Head     Onset of complaint: today LMP: Patient's last menstrual period was 05/14/2023 (exact date).  Vitals:   01/08/24 2045  BP: (!) 111/57  Pulse: 84  Resp: 16  Temp: 98.3 F (36.8 C)  SpO2: 100%    FHT:  Fetal Heart Rate Mode: Doppler Baseline Rate (A): 158 bpm Lab orders placed from triage: UA

## 2024-01-13 ENCOUNTER — Encounter: Admitting: Certified Nurse Midwife

## 2024-01-14 ENCOUNTER — Other Ambulatory Visit: Payer: Self-pay

## 2024-01-14 ENCOUNTER — Encounter: Payer: Self-pay | Admitting: Obstetrics and Gynecology

## 2024-01-14 ENCOUNTER — Ambulatory Visit (INDEPENDENT_AMBULATORY_CARE_PROVIDER_SITE_OTHER): Admitting: Obstetrics and Gynecology

## 2024-01-14 VITALS — BP 107/66 | HR 74 | Wt 172.5 lb

## 2024-01-14 DIAGNOSIS — O99013 Anemia complicating pregnancy, third trimester: Secondary | ICD-10-CM | POA: Diagnosis not present

## 2024-01-14 DIAGNOSIS — Z3A35 35 weeks gestation of pregnancy: Secondary | ICD-10-CM

## 2024-01-14 DIAGNOSIS — D508 Other iron deficiency anemias: Secondary | ICD-10-CM

## 2024-01-14 DIAGNOSIS — Z3493 Encounter for supervision of normal pregnancy, unspecified, third trimester: Secondary | ICD-10-CM

## 2024-01-14 NOTE — Progress Notes (Addendum)
   PRENATAL VISIT NOTE  Subjective:  Ana Horton is a 23 y.o. G4P0030 at [redacted]w[redacted]d being seen today for ongoing prenatal care.  She is currently monitored for the following issues for this low-risk pregnancy and has Supervision of low-risk pregnancy; History of ectopic pregnancy; and Iron deficiency anemia secondary to inadequate dietary iron intake on their problem list.  Patient reports no complaints.  Contractions: Irritability. Vag. Bleeding: None.  Movement: Present. Denies leaking of fluid.   The following portions of the patient's history were reviewed and updated as appropriate: allergies, current medications, past family history, past medical history, past social history, past surgical history and problem list.   Objective:    Vitals:   01/14/24 0836  BP: 107/66  Pulse: 74  Weight: 172 lb 8 oz (78.2 kg)    Fetal Status:  Fetal Heart Rate (bpm): 147 Fundal Height: 34 cm Movement: Present    General: Alert, oriented and cooperative. Patient is in no acute distress.  Skin: Skin is warm and dry. No rash noted.   Cardiovascular: Normal heart rate noted  Respiratory: Normal respiratory effort, no problems with respiration noted  Abdomen: Soft, gravid, appropriate for gestational age.  Pain/Pressure: Present     Pelvic: Cervical exam deferred        Extremities: Normal range of motion.  Edema: Trace  Mental Status: Normal mood and affect. Normal behavior. Normal judgment and thought content.   Assessment and Plan:  Pregnancy: G4P0030 at [redacted]w[redacted]d 1. Encounter for supervision of low-risk pregnancy in third trimester (Primary) Patient is doing well without complaints Cultures next visit Patient is still interested in waterbirth and has completed the class Desires to know EFW- discussed leopold at later visits Elevated BP on 7/10- normotensive today. Will continue to monitor  2. Iron deficiency anemia secondary to inadequate dietary iron intake Continue oral supplement  Preterm  labor symptoms and general obstetric precautions including but not limited to vaginal bleeding, contractions, leaking of fluid and fetal movement were reviewed in detail with the patient. Please refer to After Visit Summary for other counseling recommendations.   Return in about 1 week (around 01/21/2024) for in person, ROB, Low risk.  Future Appointments  Date Time Provider Department Center  01/20/2024 10:55 AM Delores Nidia CROME, FNP [facility]  01/27/2024  9:55 AM Vannie Cornell SAUNDERS, CNM [facility]  02/03/2024  3:55 PM Vannie Cornell SAUNDERS ED[facility]  02/10/2024  3:15 PM Vannie Cornell SAUNDERS EDDY [facility]  02/17/2024  1:15 PM [facility] US2 [facility]  02/17/2024  1:55 PM Vannie Cornell SAUNDERS, CNM [facility]    Winton Felt, MD

## 2024-01-20 ENCOUNTER — Other Ambulatory Visit: Payer: Self-pay | Admitting: Obstetrics and Gynecology

## 2024-01-20 ENCOUNTER — Other Ambulatory Visit: Payer: Self-pay

## 2024-01-20 ENCOUNTER — Other Ambulatory Visit (HOSPITAL_COMMUNITY)
Admission: RE | Admit: 2024-01-20 | Discharge: 2024-01-20 | Disposition: A | Discharge status: Home / Self Care | Source: Ambulatory Visit | Attending: Obstetrics and Gynecology | Admitting: Obstetrics and Gynecology

## 2024-01-20 ENCOUNTER — Encounter: Payer: Self-pay | Admitting: Certified Nurse Midwife

## 2024-01-20 ENCOUNTER — Ambulatory Visit: Admitting: Obstetrics and Gynecology

## 2024-01-20 VITALS — BP 104/70 | HR 92 | Wt 174.5 lb

## 2024-01-20 DIAGNOSIS — Z3483 Encounter for supervision of other normal pregnancy, third trimester: Secondary | ICD-10-CM | POA: Insufficient documentation

## 2024-01-20 DIAGNOSIS — Z3A35 35 weeks gestation of pregnancy: Secondary | ICD-10-CM | POA: Diagnosis not present

## 2024-01-20 DIAGNOSIS — Z3493 Encounter for supervision of normal pregnancy, unspecified, third trimester: Secondary | ICD-10-CM | POA: Diagnosis not present

## 2024-01-20 NOTE — Patient Instructions (Signed)

## 2024-01-20 NOTE — Progress Notes (Signed)
   PRENATAL VISIT NOTE  Subjective:  Ana Horton is a 23 y.o. G4P0030 at [redacted]w[redacted]d being seen today for ongoing prenatal care.  She is currently monitored for the following issues for this low-risk pregnancy and has Supervision of low-risk pregnancy; History of ectopic pregnancy; and Iron deficiency anemia secondary to inadequate dietary iron intake on their problem list.  Patient reports intermittent cramping and vaginal pressure .  Contractions: Not present. Vag. Bleeding: None.  Movement: Present. Denies leaking of fluid.   The following portions of the patient's history were reviewed and updated as appropriate: allergies, current medications, past family history, past medical history, past social history, past surgical history and problem list.   Objective:    Vitals:   01/20/24 1110  BP: 104/70  Pulse: 92  Weight: 174 lb 8 oz (79.2 kg)    Fetal Status:  Fetal Heart Rate (bpm): 147 Fundal Height: 35 cm Movement: Present Presentation: Vertex  General: Alert, oriented and cooperative. Patient is in no acute distress.  Skin: Skin is warm and dry. No rash noted.   Cardiovascular: Normal heart rate noted  Respiratory: Normal respiratory effort, no problems with respiration noted  Abdomen: Soft, gravid, appropriate for gestational age.  Pain/Pressure: Absent     Pelvic: Cervical exam performed in the presence of a chaperone Dilation: 1 Effacement (%): Thick Station: Ballotable  Extremities: Normal range of motion.  Edema: None  Mental Status: Normal mood and affect. Normal behavior. Normal judgment and thought content.   Assessment and Plan:  Pregnancy: G4P0030 at [redacted]w[redacted]d 1. Encounter for supervision of low-risk pregnancy in third trimester (Primary) BP and FHR normal Doing well, feeling regular movement  FH appropriate   2. [redacted] weeks gestation of pregnancy Labor precautions discussed Sending waterbirth certificate  Swabs  collected today, signed copy in media Peds list provided  today  - GC/Chlamydia probe amp ([place])not at [facility] - Culture, beta strep (group b only)   Preterm labor symptoms and general obstetric precautions including but not limited to vaginal bleeding, contractions, leaking of fluid and fetal movement were reviewed in detail with the patient. Please refer to After Visit Summary for other counseling recommendations.   Return in one week for LOB  Future Appointments  Date Time Provider Department Center  01/27/2024  9:55 AM Vannie Cornell JONELLE EDDY [facility]  02/03/2024  3:55 PM Vannie Cornell JONELLE EDDY [facility]  02/10/2024  3:15 PM Vannie Cornell JONELLE EDDY [facility]  02/17/2024  1:15 PM [facility]  02/17/2024  1:55 PM Vannie Cornell JONELLE EDDY [facility]    Nidia Daring, [place]

## 2024-01-21 LAB — GC/CHLAMYDIA PROBE AMP (~~LOC~~) NOT AT ARMC
Chlamydia: NEGATIVE
Comment: NEGATIVE
Comment: NORMAL
Neisseria Gonorrhea: NEGATIVE

## 2024-01-23 LAB — CULTURE, BETA STREP (GROUP B ONLY): Strep Gp B Culture: NEGATIVE

## 2024-01-25 ENCOUNTER — Ambulatory Visit: Payer: Self-pay | Admitting: Obstetrics and Gynecology

## 2024-01-27 ENCOUNTER — Ambulatory Visit: Admitting: Certified Nurse Midwife

## 2024-01-27 ENCOUNTER — Other Ambulatory Visit: Payer: Self-pay

## 2024-01-27 VITALS — BP 104/64 | HR 80 | Wt 176.0 lb

## 2024-01-27 DIAGNOSIS — Z3A36 36 weeks gestation of pregnancy: Secondary | ICD-10-CM | POA: Diagnosis not present

## 2024-01-27 DIAGNOSIS — Z3493 Encounter for supervision of normal pregnancy, unspecified, third trimester: Secondary | ICD-10-CM

## 2024-01-27 NOTE — Progress Notes (Signed)
   PRENATAL VISIT NOTE  Subjective:  Ana Horton is a 23 y.o. G4P0030 at [redacted]w[redacted]d being seen today for ongoing prenatal care.  She is currently monitored for the following issues for this low-risk pregnancy and has Supervision of low-risk pregnancy; History of ectopic pregnancy; and Iron deficiency anemia secondary to inadequate dietary iron intake on their problem list.  Patient reports occasional contractions.   . Vag. Bleeding: None.  Movement: Present. Denies leaking of fluid.   The following portions of the patient's history were reviewed and updated as appropriate: allergies, current medications, past family history, past medical history, past social history, past surgical history and problem list.   Objective:    Vitals:   01/27/24 1029  BP: 104/64  Pulse: 80  Weight: 176 lb (79.8 kg)    Fetal Status:  Fetal Heart Rate (bpm): 146 Fundal Height: 36 cm Movement: Present Presentation: Vertex  General: Alert, oriented and cooperative. Patient is in no acute distress.  Skin: Skin is warm and dry. No rash noted.   Cardiovascular: Normal heart rate noted  Respiratory: Normal respiratory effort, no problems with respiration noted  Abdomen: Soft, gravid, appropriate for gestational age.  Pain/Pressure: Absent     Pelvic: Cervical exam performed in the presence of a chaperone Dilation: 1 Effacement (%): 50 Station: -3  Extremities: Normal range of motion.  Edema: None  Mental Status: Normal mood and affect. Normal behavior. Normal judgment and thought content.   Assessment and Plan:  Pregnancy: G4P0030 at [redacted]w[redacted]d 1. Encounter for supervision of low-risk pregnancy in third trimester (Primary) - Doing well, feeling regular and vigorous fetal movement   2. [redacted] weeks gestation of pregnancy - Routine OB care (GBS, GC negative at last visit)  Preterm labor symptoms and general obstetric precautions including but not limited to vaginal bleeding, contractions, leaking of fluid and fetal  movement were reviewed in detail with the patient. Please refer to After Visit Summary for other counseling recommendations.   Return in about 1 week (around 02/03/2024) for IN-PERSON, LOB.  Future Appointments  Date Time Provider Department Center  02/03/2024  3:55 PM Vannie Cornell JONELLE EDDY [facility]  02/10/2024  3:15 PM Vannie Cornell JONELLE EDDY [facility]  02/17/2024  1:15 PM [facility] US2 [facility]  02/17/2024  1:55 PM Vannie Cornell JONELLE, CNM [facility]    Cornell JONELLE Vannie, CNM

## 2024-01-27 NOTE — Progress Notes (Signed)
 3rd triester labs collected during last visit (01/20/24), which resulted negative for both GBS and G/C   No questions but wants cervix checked   Anmol Fleck, CCMA

## 2024-02-03 ENCOUNTER — Encounter (HOSPITAL_COMMUNITY): Payer: Self-pay | Admitting: Obstetrics and Gynecology

## 2024-02-03 ENCOUNTER — Ambulatory Visit (INDEPENDENT_AMBULATORY_CARE_PROVIDER_SITE_OTHER)

## 2024-02-03 ENCOUNTER — Other Ambulatory Visit: Payer: Self-pay

## 2024-02-03 ENCOUNTER — Inpatient Hospital Stay (HOSPITAL_COMMUNITY)
Admission: AD | Admit: 2024-02-03 | Discharge: 2024-02-03 | Disposition: A | Discharge status: Home / Self Care | Attending: Obstetrics and Gynecology | Admitting: Obstetrics and Gynecology

## 2024-02-03 ENCOUNTER — Ambulatory Visit: Admitting: Certified Nurse Midwife

## 2024-02-03 VITALS — BP 103/67 | HR 82 | Wt 181.4 lb

## 2024-02-03 DIAGNOSIS — Z3493 Encounter for supervision of normal pregnancy, unspecified, third trimester: Secondary | ICD-10-CM

## 2024-02-03 DIAGNOSIS — Z3A37 37 weeks gestation of pregnancy: Secondary | ICD-10-CM

## 2024-02-03 DIAGNOSIS — O368130 Decreased fetal movements, third trimester, not applicable or unspecified: Secondary | ICD-10-CM

## 2024-02-03 DIAGNOSIS — Z3689 Encounter for other specified antenatal screening: Secondary | ICD-10-CM

## 2024-02-03 NOTE — MAU Provider Note (Signed)
 History     CSN: [id number]  Arrival date and time: 02/03/24 1734   Event Date/Time   First Provider Initiated Contact with Patient 02/03/24 1941      Chief Complaint  Patient presents with   Decreased Fetal Movement   HPI Ms. Ana Horton is a 23 y.o. year old G24P0030 female at [redacted]w[redacted]d weeks gestation who was sent to MAU from her OB appointment reporting DFM since last week. She had a BPP 8/8 in the office today. There was no time for an NST while in the office. She is also reports mild Braxton-Hicks contractions. She denies VB or LOF. She receives [facility] with [facility]; next appt is 02/10/2024. Her family member is present and contributing to the history taking.    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   Vaginitis 11/24/2019    Past Surgical History:  Procedure Laterality Date   LAPAROSCOPIC UNILATERAL SALPINGECTOMY Left 08/10/2021   Procedure: LAPAROSCOPIC LEFT SALPINGECTOMY WITH REMOVAL OF ECTOPIC PREGNANCY;  Surgeon: Zina Jerilynn LABOR, MD;  Location: [facility] OR;  Service: Gynecology;  Laterality: Left;    Family History  Problem Relation Age of Onset   Hypertension Father    Thyroid disease Mother     Social History   Tobacco Use   Smoking status: Never   Smokeless tobacco: Never   Tobacco comments:    black and mild  Vaping Use   Vaping status: Former  Substance Use Topics   Alcohol use: Not Currently   Drug use: Yes    Frequency: 1.0 times per week    Types: Marijuana    Comment: pt  states using marijuana for sleep, and to help with nausea    Allergies: No Known Allergies  Medications Prior to Admission  Medication Sig Dispense Refill Last Dose/Taking   Prenatal Vit-Fe Fumarate-FA (PRENATAL VITAMIN) 27-0.8 MG TABS Take 1 tablet by mouth daily.   02/02/2024 Morning   aspirin  EC 81 MG tablet Take 1 tablet (81 mg total) by mouth daily. Start taking when you are [redacted] weeks pregnant for rest of pregnancy for prevention of preeclampsia (Patient not taking: Reported on 02/03/2024) 300 tablet 2    ferrous gluconate  (FERGON) 324 MG tablet Take 1 tablet (324 mg total) by mouth daily with breakfast. (Patient not taking: Reported on 02/03/2024) 30 tablet 3    ondansetron  (ZOFRAN ) 4 MG tablet Take 1 tablet (4 mg total) by mouth every 8 (eight) hours as needed for nausea or vomiting. (Patient not taking: Reported on 02/03/2024) 20 tablet 0     Review of Systems  Constitutional: Negative.   HENT: Negative.    Eyes: Negative.   Gastrointestinal: Negative.   Endocrine: Negative.   Genitourinary:  DFM since last week  Musculoskeletal: Negative.   Skin: Negative.   Allergic/Immunologic: Negative.   Neurological: Negative.   Hematological: Negative.   Psychiatric/Behavioral: Negative.     Physical Exam   Blood pressure 110/64, pulse 76, temperature 98.3 F (36.8 C), temperature source Oral, resp. rate 16, height 5' 4 (1.626 m), weight 81.6 kg, last menstrual period 05/14/2023, SpO2 100%, unknown if currently breastfeeding.  Physical Exam Vitals and nursing note reviewed.  Constitutional:      Appearance: Normal appearance. She is normal weight.  Cardiovascular:     Rate and Rhythm: Normal rate.  Genitourinary:    Comments: SVE deferred d/t irreg UC's Skin:    General: Skin is warm and dry.  Neurological:     Mental Status: She is alert and oriented to person, place, and time.  Psychiatric:        Mood and Affect: Mood normal.        Behavior: Behavior normal.         Thought Content: Thought content normal.        Judgment: Judgment normal.    REACTIVE NST - FHR: 135 bpm / moderate variability / accels present / decels absent / TOCO: irregular every 3-6 mins  MAU Course  Procedures  MDM EFM  Assessment and Plan  1. NST (non-stress test) reactive (Primary)  2. [redacted] weeks gestation of pregnancy - Reassurance given that BPP 8/8 and reactive NST is optimal outcome for baby at this time - If desires to schedule IOL, can be done with Jamilla at her next ROB - Patient verbalized an understanding of the plan of care and agrees.   Rozell Kettlewell, CNM 02/03/2024, 7:52 PM

## 2024-02-03 NOTE — MAU Note (Signed)
..  Ana Horton is a 23 y.o. at [redacted]w[redacted]d here in MAU reporting: decreased fetal movement since last week. Went to scheduled prenatal appointment earlier today and was sent for further monitoring.   Denies leaking fluid or vaginal bleeding. Reports mild Braxton Hicks contractions.   Pain score: no pain reported Vitals:   02/03/24 1803  BP: 112/68  Pulse: 75  Resp: 16  Temp: 98.3 F (36.8 C)     FHT:140 Lab orders placed from triage:

## 2024-02-03 NOTE — Progress Notes (Signed)
   PRENATAL VISIT NOTE  Subjective:  Ana Horton is a 23 y.o. G4P0030 at [redacted]w[redacted]d being seen today for ongoing prenatal care.  She is currently monitored for the following issues for this low-risk pregnancy and has Supervision of low-risk pregnancy; History of ectopic pregnancy; and Iron deficiency anemia secondary to inadequate dietary iron intake on their problem list.  Patient reports decreased fetal movement for the last 4 days, very little since she got up around 2:30pm today. Has also noted frequent wetness, but no gushes of fluid etc. Did have a cervical exam at last visit.  Contractions: Irregular. Vag. Bleeding: None.  Movement: (!) Decreased. Denies leaking of fluid.   The following portions of the patient's history were reviewed and updated as appropriate: allergies, current medications, past family history, past medical history, past social history, past surgical history and problem list.   Objective:   Vitals:   02/03/24 1607  BP: 103/67  Pulse: 82  Weight: 181 lb 6.4 oz (82.3 kg)   Fetal Status:  Fetal Heart Rate (bpm): 150   Movement: (!) Decreased    General: Alert, oriented and cooperative. Patient is in no acute distress.  Skin: Skin is warm and dry. No rash noted.   Cardiovascular: Normal heart rate noted  Respiratory: Normal respiratory effort, no problems with respiration noted  Abdomen: Soft, gravid, appropriate for gestational age.  Pain/Pressure: Present     Pelvic: Cervical exam deferred        Extremities: Normal range of motion.  Edema: None  Mental Status: Normal mood and affect. Normal behavior. Normal judgment and thought content.   Assessment and Plan:  Pregnancy: G4P0030 at [redacted]w[redacted]d 1. Encounter for supervision of low-risk pregnancy in third trimester (Primary) - Feeling well but concerned about the decreased fetal movement  2. [redacted] weeks gestation of pregnancy - Routine OB care - Would like IOL at 40 weeks  3. Decreased fetal movements in third  trimester, single or unspecified fetus - BPP 8/8 but given continued decreased movement, will send for extended monitoring at MAU. - Report called to Rolitta Dawson, CNM in MAU, pt will proceed there. Reviewed possible outcomes, aware it is a remote possibility she could be kept for IOL, most likely will be sent home if NST reactive. - US  Fetal BPP W/O Non Stress; Future  Term labor symptoms and general obstetric precautions including but not limited to vaginal bleeding, contractions, leaking of fluid and fetal movement were reviewed in detail with the patient. Please refer to After Visit Summary for other counseling recommendations.   Return in about 1 week (around 02/10/2024) for IN-PERSON, LOB.  Future Appointments  Date Time Provider Department Center  02/10/2024  3:15 PM Vannie Cornell JONELLE EDDY [facility]  02/17/2024  1:15 PM [facility] US2 [facility]  02/17/2024  1:55 PM Vannie Cornell JONELLE, CNM [facility]    Cornell JONELLE Vannie, CNM

## 2024-02-05 ENCOUNTER — Encounter: Payer: Self-pay | Admitting: Certified Nurse Midwife

## 2024-02-09 ENCOUNTER — Telehealth: Payer: Self-pay

## 2024-02-09 NOTE — Telephone Encounter (Signed)
 Called pt and pt asked if she can have a membrane sweep.  I advised pt that she can have that discussion with the provider tomorrow at her appt as if her cervix is variable the provider may decide to meet her request.  Pt also stated that she was interested in an induction.  I explained to the pt that tomorrows appt is where she would have that discussion about induction as well.  Pt encouraged to write down questions and bring to her appt. Pt verbalized understanding.  Avantae Bither,RN  02/09/24

## 2024-02-10 ENCOUNTER — Ambulatory Visit: Admitting: Certified Nurse Midwife

## 2024-02-10 ENCOUNTER — Other Ambulatory Visit: Payer: Self-pay

## 2024-02-10 VITALS — BP 102/68 | HR 84 | Wt 178.3 lb

## 2024-02-10 DIAGNOSIS — Z3493 Encounter for supervision of normal pregnancy, unspecified, third trimester: Secondary | ICD-10-CM

## 2024-02-10 DIAGNOSIS — Z3A38 38 weeks gestation of pregnancy: Secondary | ICD-10-CM | POA: Diagnosis not present

## 2024-02-10 DIAGNOSIS — D508 Other iron deficiency anemias: Secondary | ICD-10-CM | POA: Diagnosis not present

## 2024-02-11 MED ORDER — FERROUS GLUCONATE 324 (38 FE) MG PO TABS: 324.0000 mg | ORAL_TABLET | Freq: Every day | ORAL | 3 refills | Status: AC

## 2024-02-11 NOTE — Progress Notes (Signed)
   PRENATAL VISIT NOTE  Subjective:  Ana Horton is a 23 y.o. G4P0030 at [redacted]w[redacted]d being seen today for ongoing prenatal care.  She is currently monitored for the following issues for this low-risk pregnancy and has Supervision of low-risk pregnancy; History of ectopic pregnancy; and Iron deficiency anemia secondary to inadequate dietary iron intake on their problem list.  Patient reports no complaints.  Contractions: Irregular. Vag. Bleeding: None.  Movement: Present. Denies leaking of fluid.   The following portions of the patient's history were reviewed and updated as appropriate: allergies, current medications, past family history, past medical history, past social history, past surgical history and problem list.   Objective:    Vitals:   02/10/24 1603  BP: 102/68  Pulse: 84  Weight: 178 lb 4.8 oz (80.9 kg)    Fetal Status:  Fetal Heart Rate (bpm): 140 Fundal Height: 37 cm Movement: Present Presentation: Vertex  General: Alert, oriented and cooperative. Patient is in no acute distress.  Skin: Skin is warm and dry. No rash noted.   Cardiovascular: Normal heart rate noted  Respiratory: Normal respiratory effort, no problems with respiration noted  Abdomen: Soft, gravid, appropriate for gestational age.  Pain/Pressure: Absent     Pelvic: Cervical exam and membrane sweep performed in the presence of a chaperone. Dilation: 1 Effacement (%): 50 Station: -2  Extremities: Normal range of motion.  Edema: None  Mental Status: Normal mood and affect. Normal behavior. Normal judgment and thought content.   Assessment and Plan:  Pregnancy: G4P0030 at [redacted]w[redacted]d 1. Encounter for supervision of low-risk pregnancy in third trimester (Primary) - Doing well, feeling regular and vigorous fetal movement, fetal movement much better this week  2. [redacted] weeks gestation of pregnancy - Routine OB care  - Continues to strongly desire eIOL, is on the list for tomorrow and will place orders - Advised of link  between eIOL and pCS in primigravidas - Reviewed methods of induction and advised to ambulate early/often, delay ROM until after active labor pattern/and >6-7cm.  3. Iron deficiency anemia secondary to inadequate dietary iron intake - ferrous gluconate  (FERGON) 324 MG tablet; Take 1 tablet (324 mg total) by mouth daily with breakfast.  Dispense: 30 tablet; Refill: 3  Term labor symptoms and general obstetric precautions including but not limited to vaginal bleeding, contractions, leaking of fluid and fetal movement were reviewed in detail with the patient. Please refer to After Visit Summary for other counseling recommendations.   Return in about 6 weeks (around 03/23/2024) for PP.  Future Appointments  Date Time Provider Department Center  02/17/2024  1:55 PM Vannie Cornell JONELLE EDDY [facility]  02/25/2024  6:45 AM [facility] SCHED ROOM [facility] None  03/23/2024  3:35 PM Vannie Cornell JONELLE, CNM [facility] [place]    Cornell JONELLE Vannie, CNM

## 2024-02-15 ENCOUNTER — Encounter (HOSPITAL_COMMUNITY): Payer: Self-pay | Admitting: Family Medicine

## 2024-02-15 ENCOUNTER — Other Ambulatory Visit: Payer: Self-pay

## 2024-02-15 ENCOUNTER — Inpatient Hospital Stay (HOSPITAL_COMMUNITY): Admitting: Anesthesiology

## 2024-02-15 ENCOUNTER — Inpatient Hospital Stay (HOSPITAL_COMMUNITY)
Admission: RE | Admit: 2024-02-15 | Discharge: 2024-02-17 | DRG: 807 | Disposition: A | Discharge status: Home / Self Care | Payer: Self-pay | Attending: Obstetrics and Gynecology | Admitting: Obstetrics and Gynecology

## 2024-02-15 DIAGNOSIS — Z3A39 39 weeks gestation of pregnancy: Secondary | ICD-10-CM | POA: Diagnosis not present

## 2024-02-15 DIAGNOSIS — D509 Iron deficiency anemia, unspecified: Secondary | ICD-10-CM | POA: Diagnosis present

## 2024-02-15 DIAGNOSIS — O9902 Anemia complicating childbirth: Principal | ICD-10-CM | POA: Diagnosis present

## 2024-02-15 DIAGNOSIS — Z8249 Family history of ischemic heart disease and other diseases of the circulatory system: Secondary | ICD-10-CM | POA: Diagnosis not present

## 2024-02-15 DIAGNOSIS — Z8759 Personal history of other complications of pregnancy, childbirth and the puerperium: Secondary | ICD-10-CM

## 2024-02-15 DIAGNOSIS — Z3493 Encounter for supervision of normal pregnancy, unspecified, third trimester: Secondary | ICD-10-CM

## 2024-02-15 DIAGNOSIS — D508 Other iron deficiency anemias: Secondary | ICD-10-CM | POA: Diagnosis present

## 2024-02-15 DIAGNOSIS — Z3490 Encounter for supervision of normal pregnancy, unspecified, unspecified trimester: Secondary | ICD-10-CM

## 2024-02-15 DIAGNOSIS — O26893 Other specified pregnancy related conditions, third trimester: Secondary | ICD-10-CM | POA: Diagnosis present

## 2024-02-15 LAB — CBC
HCT: 32.4 % — ABNORMAL LOW (ref 36.0–46.0)
Hemoglobin: 10.6 g/dL — ABNORMAL LOW (ref 12.0–15.0)
MCH: 28.8 pg (ref 26.0–34.0)
MCHC: 32.7 g/dL (ref 30.0–36.0)
MCV: 88 fL (ref 80.0–100.0)
Platelets: 261 K/uL (ref 150–400)
RBC: 3.68 MIL/uL — ABNORMAL LOW (ref 3.87–5.11)
RDW: 14.5 % (ref 11.5–15.5)
WBC: 10 K/uL (ref 4.0–10.5)
nRBC: 0 % (ref 0.0–0.2)

## 2024-02-15 LAB — TYPE AND SCREEN
ABO/RH(D): A POS
Antibody Screen: NEGATIVE

## 2024-02-15 LAB — RPR: RPR Ser Ql: NONREACTIVE

## 2024-02-15 MED ORDER — LIDOCAINE HCL (PF) 1 % IJ SOLN: 30.0000 mL | INTRAMUSCULAR | Status: DC | PRN

## 2024-02-15 MED ORDER — LACTATED RINGERS IV SOLN
500.0000 mL | INTRAVENOUS | Status: DC | PRN
  Administered 2024-02-15: 500 mL via INTRAVENOUS

## 2024-02-15 MED ORDER — SODIUM CHLORIDE 0.9 % IV SOLN: 250.0000 mL | INTRAVENOUS | Status: DC | PRN

## 2024-02-15 MED ORDER — SOD CITRATE-CITRIC ACID 500-334 MG/5ML PO SOLN: 30.0000 mL | ORAL | Status: DC | PRN

## 2024-02-15 MED ORDER — ONDANSETRON HCL 4 MG/2ML IJ SOLN
4.0000 mg | Freq: Four times a day (QID) | INTRAMUSCULAR | Status: DC | PRN
  Administered 2024-02-16: 4 mg via INTRAVENOUS
  Filled 2024-02-15: qty 2

## 2024-02-15 MED ORDER — LACTATED RINGERS IV SOLN
500.0000 mL | Freq: Once | INTRAVENOUS | Status: AC
  Administered 2024-02-15: 500 mL via INTRAVENOUS

## 2024-02-15 MED ORDER — ACETAMINOPHEN 325 MG PO TABS: 650.0000 mg | ORAL_TABLET | ORAL | Status: DC | PRN

## 2024-02-15 MED ORDER — EPHEDRINE 5 MG/ML INJ: 10.0000 mg | INTRAVENOUS | Status: DC | PRN

## 2024-02-15 MED ORDER — PHENYLEPHRINE 80 MCG/ML (10ML) SYRINGE FOR IV PUSH (FOR BLOOD PRESSURE SUPPORT): 80.0000 ug | PREFILLED_SYRINGE | INTRAVENOUS | Status: DC | PRN

## 2024-02-15 MED ORDER — OXYTOCIN-SODIUM CHLORIDE 30-0.9 UT/500ML-% IV SOLN
2.5000 [IU]/h | INTRAVENOUS | Status: DC
  Filled 2024-02-15: qty 500

## 2024-02-15 MED ORDER — DIPHENHYDRAMINE HCL 50 MG/ML IJ SOLN: 12.5000 mg | INTRAMUSCULAR | Status: DC | PRN

## 2024-02-15 MED ORDER — OXYCODONE-ACETAMINOPHEN 5-325 MG PO TABS: 2.0000 | ORAL_TABLET | ORAL | Status: DC | PRN

## 2024-02-15 MED ORDER — OXYCODONE-ACETAMINOPHEN 5-325 MG PO TABS: 1.0000 | ORAL_TABLET | ORAL | Status: DC | PRN

## 2024-02-15 MED ORDER — LIDOCAINE HCL (PF) 1 % IJ SOLN
INTRAMUSCULAR | Status: DC | PRN
  Administered 2024-02-15: 8 mL via EPIDURAL

## 2024-02-15 MED ORDER — LACTATED RINGERS IV SOLN: INTRAVENOUS | Status: DC

## 2024-02-15 MED ORDER — MISOPROSTOL 50MCG HALF TABLET
50.0000 ug | ORAL_TABLET | ORAL | Status: DC | PRN
  Administered 2024-02-15 (×2): 50 ug via BUCCAL
  Filled 2024-02-15 (×2): qty 1

## 2024-02-15 MED ORDER — TERBUTALINE SULFATE 1 MG/ML IJ SOLN
0.2500 mg | Freq: Once | INTRAMUSCULAR | Status: AC | PRN
  Administered 2024-02-15: 0.25 mg via SUBCUTANEOUS
  Filled 2024-02-15: qty 1

## 2024-02-15 MED ORDER — OXYTOCIN BOLUS FROM INFUSION
333.0000 mL | Freq: Once | INTRAVENOUS | Status: AC
  Administered 2024-02-16: 333 mL via INTRAVENOUS

## 2024-02-15 MED ORDER — OXYTOCIN-SODIUM CHLORIDE 30-0.9 UT/500ML-% IV SOLN
1.0000 m[IU]/min | INTRAVENOUS | Status: DC
  Administered 2024-02-15: 2 m[IU]/min via INTRAVENOUS

## 2024-02-15 MED ORDER — FENTANYL-BUPIVACAINE-NACL 0.5-0.125-0.9 MG/250ML-% EP SOLN
12.0000 mL/h | EPIDURAL | Status: DC | PRN
  Administered 2024-02-15: 12 mL/h via EPIDURAL
  Filled 2024-02-15: qty 250

## 2024-02-15 MED ORDER — FENTANYL CITRATE (PF) 100 MCG/2ML IJ SOLN: 50.0000 ug | INTRAMUSCULAR | Status: DC | PRN

## 2024-02-15 MED ORDER — SODIUM CHLORIDE 0.9% FLUSH: 3.0000 mL | Freq: Two times a day (BID) | INTRAVENOUS | Status: DC

## 2024-02-15 MED ORDER — FLEET ENEMA RE ENEM: 1.0000 | ENEMA | RECTAL | Status: DC | PRN

## 2024-02-15 MED ORDER — SODIUM CHLORIDE 0.9% FLUSH: 3.0000 mL | INTRAVENOUS | Status: DC | PRN

## 2024-02-15 MED ORDER — TERBUTALINE SULFATE 1 MG/ML IJ SOLN: 0.2500 mg | Freq: Once | INTRAMUSCULAR | Status: DC | PRN

## 2024-02-15 NOTE — H&P (Signed)
 OBSTETRIC ADMISSION HISTORY AND PHYSICAL  Ana Horton is 23 y.o. G4P0030 with IUP at [redacted]w[redacted]d 02/18/2024, by Last Menstrual Period presenting for eIOL. She received her prenatal care at [facility]   ROS (+) FM, ctx occasional (-) VB, LOF. HA, visual changes, CP, SOB, RUQ pain, peripheral edema.   Prenatal History/Complications NURSING  PROVIDER  Conservator, museum/gallery for Women Dating by LMP c/w U/S at 6.6 wks  [facility] Model Traditional Anatomy U/S normal  Initiated care at  [facility]	[place]  English               LAB RESULTS   Support Person [employer] NIPS: LR AFP:       NT/IT (FT only)        Carrier Screen Horizon:   Rhogam  A/Positive/-- (02/04 1626) A1C/GTT Early HgbA1C:  Third trimester 2 hr GTT:   Flu Vaccine        TDaP Vaccine   Blood Type A/Positive/-- (02/04 1626)  RSV Vaccine   Antibody Negative (02/04 1626)  COVID Vaccine Pfizer 1 dose  Rubella 6.23 (02/04 1626)  Feeding Plan breast RPR Non Reactive (05/21 1050)  Contraception no method HBsAg Negative (02/04 1626)  Circumcision girl HIV Non Reactive (05/21 1050)  Pediatrician  List given  HCVAb Non Reactive (02/04 1626)  Prenatal Classes        BTL Consent   Pap       Diagnosis  Date Value Ref Range Status  08/04/2023     Final    - Negative for intraepithelial lesion or malignancy (NILM)    BTL Pre-payment   GC/CT Initial:   36wks:    VBAC Consent   GBS For PCN allergy, check sensitivities   BRx Optimized? [ ]  yes   [ ]  no      DME Rx [ ]  BP cuff [ ]  Weight Scale Waterbirth  [ ]  Class [ ]  Consent [ ]  CNM visit  PHQ9 & GAD7 [  ] new OB [  ] 28 weeks  [  ] 36 weeks Induction  [ ]  Orders Entered [ ] Foley Y/N   Patient Active Problem List   Diagnosis Date Noted   Indication for care in labor or delivery 02/15/2024   Iron deficiency anemia secondary to inadequate dietary iron intake 11/21/2023   History of ectopic pregnancy 08/05/2023   Supervision of low-risk pregnancy 07/28/2023    Past  Medical History: Past Medical History:  Diagnosis Date   Chlamydia    Contraception management 11/24/2019   Nexplanon in L arm, per patient placed at [facility] at the end of 2020     Depression with anxiety 11/24/2019   11/23/2019 - start Zoloft  50mg      Ectopic pregnancy, tubal 08/10/2021   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   Pelvic 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   Vaginitis 11/24/2019    Past Surgical History: Past Surgical History:  Procedure Laterality Date   LAPAROSCOPIC UNILATERAL SALPINGECTOMY Left 08/10/2021   Procedure: LAPAROSCOPIC LEFT SALPINGECTOMY WITH REMOVAL OF ECTOPIC PREGNANCY;  Surgeon: Zina Jerilynn LABOR, MD;  Location[facility] OR;  Service: Gynecology;  Laterality: Left;    Social History Social History   Socioeconomic History   Marital status: Single    Spouse name: Not on file   Number of children: Not on file   Years of education: Not on file   Highest education level: 12th grade  Occupational History  Not on file  Tobacco Use   Smoking status: Never   Smokeless tobacco: Never   Tobacco comments:    black and mild  Vaping Use   Vaping status: Former  Substance and Sexual Activity   Alcohol use: Not Currently   Drug use: Yes    Frequency: 1.0 times per week    Types: Marijuana    Comment: pt states using marijuana for sleep, and to help with nausea   Sexual activity: Yes    Comment: nexplanon out around 2022  Other Topics Concern   Not on file  Social History Narrative   Not on file   Social Drivers of Health   Financial Resource Strain: Low Risk  (01/27/2024)   Overall Financial Resource Strain (CARDIA)    Difficulty of Paying Living Expenses: Not very hard  Food Insecurity: No Food Insecurity (02/15/2024)   Hunger Vital Sign    Worried About Running Out of Food in the Last Year: Never true    Ran Out of Food in the Last Year: Never true  Transportation Needs: No Transportation Needs (02/15/2024)   PRAPARE - Scientist, research (physical sciences) (Medical): No    Lack of Transportation (Non-Medical): No  Physical Activity: Insufficiently Active (01/27/2024)   Exercise Vital Sign    Days of Exercise per Week: 3 days    Minutes of Exercise per Session: 30 min  Stress: No Stress Concern Present (01/27/2024)   [employer] of Occupational Health - Occupational Stress Questionnaire    Feeling of Stress: Only a little  Social Connections: Moderately Integrated (01/27/2024)   Social Connection and Isolation Panel    Frequency of Communication with Friends and Family: Twice a week    Frequency of Social Gatherings with Friends and Family: Twice a week    Attends Religious Services: More than 4 times per year    Active Member of [employer] or Organizations: No    Attends Engineer, structural: Not on file    Marital Status: Living with partner    Family History: Family History  Problem Relation Age of Onset   Hypertension Father    Thyroid disease Mother     Allergies: No Known Allergies  Medications Prior to Admission  Medication Sig Dispense Refill Last Dose/Taking   ferrous gluconate  (FERGON) 324 MG tablet Take 1 tablet (324 mg total) by mouth daily with breakfast. 30 tablet 3      Review of Systems  All systems reviewed and negative except as stated in HPI  PHYSICAL EXAM Height 5' 4 (1.626 m), weight 82 kg, last menstrual period 05/14/2023, unknown if currently breastfeeding. General appearance: alert, cooperative, and appears stated age Lungs: respirations nonlabored Heart: regular rate Abdomen: gravid  Fetal monitoringBaseline: 135 bpm, Variability: Good {> 6 bpm), Accelerations: Reactive, and Decelerations: Absent Uterine activityFrequency: Irregular, mild-moderate   Dilation: 2 Effacement (%): 50 Station: -3 Exam by:: Camie, CNM Presentation: cephalic   Prenatal labs: ABO, Rh: A/Positive/-- (02/04 1626) Antibody: Negative (02/04 1626) Rubella: 6.23 (02/04 1626) RPR: Non Reactive  (05/21 1050)  HBsAg: Negative (02/04 1626)  HIV: Non Reactive (05/21 1050)  GBS: Negative/-- (07/23 1248)    Lab Results  Component Value Date   GBS Negative 01/20/2024   Maternal Diabetes: No Genetic Screening: Normal LR NIPS/ Neg Horizon  Anatomy US : nl   There is no immunization history on file for this patient.  Prenatal Transfer Tool  Maternal Diabetes: No Genetic Screening: Normal LR NIPS/ Neg Horizon  Maternal Ultrasounds/Referrals: Normal Fetal Ultrasounds or other Referrals:  None Maternal Substance Abuse:  Yes:  Type: Marijuana, stopped upon find discovering pregnancy Significant Maternal Medications:  Meds include: Zoloft   Significant Maternal Lab Results: Group B Strep negative Number of Prenatal Visits:greater than 3 verified prenatal visits Maternal Vaccinations:none noted Other Comments:  None   Results for orders placed or performed during the hospital encounter of 02/15/24 (from the past 24 hours)  CBC   Collection Time: 02/15/24  2:30 AM  Result Value Ref Range   WBC 10.0 4.0 - 10.5 K/uL   RBC 3.68 (L) 3.87 - 5.11 MIL/uL   Hemoglobin 10.6 (L) 12.0 - 15.0 g/dL   HCT 67.5 (L) 63.9 - 53.9 %   MCV 88.0 80.0 - 100.0 fL   MCH 28.8 26.0 - 34.0 pg   MCHC 32.7 30.0 - 36.0 g/dL   RDW 85.4 88.4 - 84.4 %   Platelets 261 150 - 400 K/uL   nRBC 0.0 0.0 - 0.2 %    Patient Active Problem List   Diagnosis Date Noted   Indication for care in labor or delivery 02/15/2024   Iron deficiency anemia secondary to inadequate dietary iron intake 11/21/2023   History of ectopic pregnancy 08/05/2023   Supervision of low-risk pregnancy 07/28/2023    ASSESSMENT & PLAN Ana Horton is 23 y.o. G4P0030 with IUP at [redacted]w[redacted]d 02/18/2024, by Last Menstrual Period admitted for eIOL.  Sono @[redacted]w[redacted]d , CWD, normal anatomy, cephalic presentation, fundal placenta, EFW 323g, (60%)  #Labor: IOL in latent phase of labor. After discussion of RBA with patient of IOL methods, plan to give oral  cytotec , perform membrane sweep. Repeat cytotec  in 4h PRN. Possible  #Pain: Coping well, desires unmedicated waterbirth.  #FWB: Cat 2, overall reassuring  #IDA Hgb at admit  - prenatal iron supplement, continue PP  #h/o ectopic pregnancy - x2-3 - s/p L salpingectomy 2023  #mood d/o: denies  #GBS status:  negative #Feeding: Formula #Reproductive Life planning: Condoms #Circ:  not applicable   Camie DELENA Rote, CNM FMOB Fellow, Faculty practice [employer], Center for [employer]

## 2024-02-15 NOTE — Anesthesia Preprocedure Evaluation (Signed)
 Anesthesia Evaluation  Patient identified by MRN, date of birth, ID band Patient awake    Reviewed: Allergy & Precautions, Patient's Chart, lab work & pertinent test results  Airway Mallampati: II  TM Distance: >3 FB Neck ROM: Full    Dental no notable dental hx.    Pulmonary neg pulmonary ROS   Pulmonary exam normal breath sounds clear to auscultation       Cardiovascular negative cardio ROS Normal cardiovascular exam Rhythm:Regular Rate:Normal     Neuro/Psych  PSYCHIATRIC DISORDERS Anxiety Depression    negative neurological ROS     GI/Hepatic negative GI ROS, Neg liver ROS,,,  Endo/Other  BMI 31  Renal/GU negative Renal ROS  negative genitourinary   Musculoskeletal negative musculoskeletal ROS (+)    Abdominal   Peds  Hematology  (+) Blood dyscrasia, anemia Hb 10.6, plt 261   Anesthesia Other Findings   Reproductive/Obstetrics (+) Pregnancy PID                              Anesthesia Physical Anesthesia Plan  ASA: 2  Anesthesia Plan: Epidural   Post-op Pain Management:    Induction:   PONV Risk Score and Plan: 2  Airway Management Planned: Natural Airway  Additional Equipment: None  Intra-op Plan:   Post-operative Plan:   Informed Consent: I have reviewed the patients History and Physical, chart, labs and discussed the procedure including the risks, benefits and alternatives for the proposed anesthesia with the patient or authorized representative who has indicated his/her understanding and acceptance.       Plan Discussed with:   Anesthesia Plan Comments:         Anesthesia Quick Evaluation

## 2024-02-15 NOTE — Progress Notes (Signed)
 Labor Note = CTSP due to prolonged decel to the 80s in setting of tachysystole. Pt has already had position changes,   S: Pt comfortable with epidural. She consents to CE.   Blood pressure (!) 98/50, pulse 62, temperature (!) 97.5 F (36.4 C), temperature source Oral, height 5' 4 (1.626 m), weight 82 kg, last menstrual period 05/14/2023, SpO2 99%, unknown if currently breastfeeding.  FHT: 80s for about 8 minutes, moderate variability. Now 120s, mod var Toco: Q1-3 min CE: 5.5/80/-2  Plan: - FHT reassuring, mod var. Category 1 now.  - Will keep pitocin  off for at least 30 minutes and then review tracing to determine restart.  - Will hold on internal monitors at this time since making cervical change and able to easily monitor externally.   Vina Solian, MD Attending Obstetrician & Gynecologist, [facility] for [facility], [facility]

## 2024-02-15 NOTE — Progress Notes (Signed)
 Called to see patient for FHT tracing - prolonged decel to the 90s for 7 minutes with some return to baseline during that time although briefly. Position changes done by RN and CE shows 9.5/90/0-+1.   Spoke with patient and her support team. Reviewed at this time, she is making excellent progress and FHT recovered. Throughout this time excellent variability which makes tracing reassuring. Discussed it is possible when the time comes, that during pushing, FHT may necessitate assisted/operative vaginal delivery. Reviewed the risks of forceps and vacuum delivery to fetus. Reviewed the risks to the patient. Reviewed when used in the setting of NRFHT, that they are an alternative to c-section, not SVD. Reviewed the risks of second stage arrest c-section.   We reviewed at this time, our goal is not to use any assistance for her delivery and that my goal is to properly inform her of risks now when not emergently needed. If they become indicated, then she will have the option to consent to them with the appropriate information.   All questions answered of the patient and her support team.  Vina Solian, MD Attending Obstetrician & Gynecologist, [facility] for [facility], [facility]

## 2024-02-15 NOTE — Progress Notes (Signed)
 LABOR PROGRESS NOTE  Patient Name: Ana Horton, female   DOB: 2000/11/01, 23 y.o.  MRN: [id number]  Contractions significantly spaced after epidural placement. Will start pit 2x2. Cat I tracing.  Almarie CHRISTELLA Moats, MD

## 2024-02-15 NOTE — Anesthesia Procedure Notes (Signed)
 Epidural Patient location during procedure: OB Start time: 02/15/2024 1:18 PM End time: 02/15/2024 1:30 PM  Staffing Anesthesiologist: Merla Almarie HERO, DO Performed: anesthesiologist   Preanesthetic Checklist Completed: patient identified, IV checked, risks and benefits discussed, monitors and equipment checked, pre-op evaluation and timeout performed  Epidural Patient position: sitting Prep: DuraPrep and site prepped and draped Patient monitoring: continuous pulse ox, blood pressure, heart rate and cardiac monitor Approach: midline Location: L3-L4 Injection technique: LOR air  Needle:  Needle type: Tuohy  Needle gauge: 17 G Needle length: 9 cm Needle insertion depth: 6 cm Catheter type: closed end flexible Catheter size: 19 Gauge Catheter at skin depth: 11 cm Test dose: negative  Assessment Sensory level: T8 Events: blood not aspirated, no cerebrospinal fluid, injection not painful, no injection resistance, no paresthesia and negative IV test  Additional Notes Patient identified. Risks/Benefits/Options discussed with patient including but not limited to bleeding, infection, nerve damage, paralysis, failed block, incomplete pain control, headache, blood pressure changes, nausea, vomiting, reactions to medication both or allergic, itching and postpartum back pain. Confirmed with bedside nurse the patient's most recent platelet count. Confirmed with patient that they are not currently taking any anticoagulation, have any bleeding history or any family history of bleeding disorders. Patient expressed understanding and wished to proceed. All questions were answered. Sterile technique was used throughout the entire procedure. Please see nursing notes for vital signs. Test dose was given through epidural catheter and negative prior to continuing to dose epidural or start infusion. Warning signs of high block given to the patient including shortness of breath, tingling/numbness in  hands, complete motor block, or any concerning symptoms with instructions to call for help. Patient was given instructions on fall risk and not to get out of bed. All questions and concerns addressed with instructions to call with any issues or inadequate analgesia.  Reason for block:procedure for pain

## 2024-02-15 NOTE — Progress Notes (Signed)
 Patient Vitals for the past 4 hrs:  BP Temp Temp src Pulse Resp  02/15/24 2030 (!) 86/59 -- -- 93 --  02/15/24 1930 103/66 98.2 F (36.8 C) Oral 82 16  02/15/24 1901 (!) 105/56 -- -- 73 --  02/15/24 1801 (!) 95/46 -- -- 80 --  02/15/24 1648 -- (!) 97.5 F (36.4 C) Oral -- --   Comfortable w/epidural. FHR Cat 1.  Ctx q 3-4 minutes.  Cx 6-7/90/-2/asynclitic.  IUPC placed, will restart pitocin 

## 2024-02-15 NOTE — Progress Notes (Signed)
 Labor Progress Note Ana Horton is a 23 y.o. G4P0030 at [redacted]w[redacted]d presented for eIOL S:  Patient is feeling painful contractions regularly  O:  BP (!) 103/52   Pulse 67   Temp 98.3 F (36.8 C) (Oral)   Ht 5' 4 (1.626 m)   Wt 82 kg   LMP 05/14/2023 (Exact Date)   SpO2 100%   BMI 31.03 kg/m  EFM: baseline 140, moderate variability, no accels, no decels  CVE: Dilation: 2.5 Effacement (%): 60 Cervical Position: Anterior Station: -2 Presentation: Vertex Exam by:: Otten, RN   A&P: 23 y.o. G4P0030 [redacted]w[redacted]d by LMP admitted for eIOL #Labor: SROM for clear fluid and contracting regularly, plan for re-check in 2 hours or as needed and consider pitocin  at that time #Pain: coping well, currently unmedicated #FWB: Category 1 strip #GBS negative   Coolidge Moros, MD 11:21 AM

## 2024-02-16 ENCOUNTER — Encounter (HOSPITAL_COMMUNITY): Payer: Self-pay | Admitting: Family Medicine

## 2024-02-16 DIAGNOSIS — Z3A39 39 weeks gestation of pregnancy: Secondary | ICD-10-CM

## 2024-02-16 MED ORDER — IBUPROFEN 600 MG PO TABS
600.0000 mg | ORAL_TABLET | Freq: Four times a day (QID) | ORAL | Status: DC
  Administered 2024-02-16 – 2024-02-17 (×6): 600 mg via ORAL
  Filled 2024-02-16 (×5): qty 1

## 2024-02-16 MED ORDER — OXYCODONE HCL 5 MG PO TABS: 5.0000 mg | ORAL_TABLET | ORAL | Status: DC | PRN

## 2024-02-16 MED ORDER — ONDANSETRON HCL 4 MG PO TABS: 4.0000 mg | ORAL_TABLET | ORAL | Status: DC | PRN

## 2024-02-16 MED ORDER — DIBUCAINE (PERIANAL) 1 % EX OINT: 1.0000 | TOPICAL_OINTMENT | CUTANEOUS | Status: DC | PRN

## 2024-02-16 MED ORDER — WITCH HAZEL-GLYCERIN EX PADS: 1.0000 | MEDICATED_PAD | CUTANEOUS | Status: DC | PRN

## 2024-02-16 MED ORDER — ZOLPIDEM TARTRATE 5 MG PO TABS: 5.0000 mg | ORAL_TABLET | Freq: Every evening | ORAL | Status: DC | PRN

## 2024-02-16 MED ORDER — BENZOCAINE-MENTHOL 20-0.5 % EX AERO: 1.0000 | INHALATION_SPRAY | CUTANEOUS | Status: DC | PRN

## 2024-02-16 MED ORDER — SIMETHICONE 80 MG PO CHEW: 80.0000 mg | CHEWABLE_TABLET | ORAL | Status: DC | PRN

## 2024-02-16 MED ORDER — COCONUT OIL OIL: 1.0000 | TOPICAL_OIL | Status: DC | PRN

## 2024-02-16 MED ORDER — TETANUS-DIPHTH-ACELL PERTUSSIS 5-2.5-18.5 LF-MCG/0.5 IM SUSY: 0.5000 mL | PREFILLED_SYRINGE | Freq: Once | INTRAMUSCULAR | Status: DC

## 2024-02-16 MED ORDER — PRENATAL MULTIVITAMIN CH
1.0000 | ORAL_TABLET | Freq: Every day | ORAL | Status: DC
  Administered 2024-02-16 – 2024-02-17 (×2): 1 via ORAL
  Filled 2024-02-16 (×2): qty 1

## 2024-02-16 MED ORDER — DIPHENHYDRAMINE HCL 25 MG PO CAPS: 25.0000 mg | ORAL_CAPSULE | Freq: Four times a day (QID) | ORAL | Status: DC | PRN

## 2024-02-16 MED ORDER — OXYCODONE HCL 5 MG PO TABS: 10.0000 mg | ORAL_TABLET | ORAL | Status: DC | PRN

## 2024-02-16 MED ORDER — ACETAMINOPHEN 325 MG PO TABS
650.0000 mg | ORAL_TABLET | ORAL | Status: DC | PRN
  Administered 2024-02-17: 650 mg via ORAL
  Filled 2024-02-16: qty 2

## 2024-02-16 MED ORDER — ONDANSETRON HCL 4 MG/2ML IJ SOLN: 4.0000 mg | INTRAMUSCULAR | Status: DC | PRN

## 2024-02-16 MED ORDER — SENNOSIDES-DOCUSATE SODIUM 8.6-50 MG PO TABS
2.0000 | ORAL_TABLET | Freq: Every day | ORAL | Status: DC
  Administered 2024-02-17: 2 via ORAL
  Filled 2024-02-16: qty 2

## 2024-02-16 NOTE — Anesthesia Postprocedure Evaluation (Signed)
 Anesthesia Post Note  Patient: Ana Horton  Procedure(s) Performed: AN AD HOC LABOR EPIDURAL     Patient location during evaluation: Mother Baby Anesthesia Type: Epidural Level of consciousness: awake and alert Pain management: pain level controlled Vital Signs Assessment: post-procedure vital signs reviewed and stable Respiratory status: spontaneous breathing, nonlabored ventilation and respiratory function stable Cardiovascular status: stable Postop Assessment: no headache, no backache and epidural receding Anesthetic complications: no   No notable events documented.  Last Vitals:  Vitals:   02/16/24 0330 02/16/24 0745  BP: (!) 109/53 (!) 103/56  Pulse: 70 64  Resp: 17 16  Temp: 37.7 C 37.3 C  SpO2: 99% 100%    Last Pain:  Vitals:   02/16/24 0746  TempSrc:   PainSc: 0-No pain   Pain Goal:                   Ana Horton

## 2024-02-16 NOTE — Lactation Note (Signed)
 This note was copied from a baby's chart. Lactation Consultation Note  Patient Name: Ana Horton Date: 02/16/2024 Age:22 hours Reason for consult: Follow-up assessment;1st time breastfeeding;Primapara;Term  P1, 39 wks, @ 9 hrs of life. LC to room @ expected feeding time. Mom feeding baby with formula with LC arrival. Discussed small belly size, expectations of milk coming in and emphasized best thing for milk production is for baby to work @ the breast every couple hours. Mom receptive to hand pump and request for stork pump. LC stepped out to complete these tasks- LC return- baby had spit up after feeding. Encouraged mom again in belly size.  Discussed expectations @ breast- Day 1- sleepy/ feed every 3 hrs/ even 10 minutes is okay, Day 2 more awake/ feeding cues/longer feeds, and cluster feeding overnights brings milk in. Highlighted breast stimulation is tied directly to milk production.  LC services and milk storage shared. Encouraged mom to call for assist anytime desired.  Maternal Data Has patient been taught Hand Expression?: Yes Does the patient have breastfeeding experience prior to this delivery?: No  Feeding Mother's Current Feeding Choice: Breast Milk and Formula   Lactation Tools Discussed/Used Tools: Pump;Flanges Breast pump type: Manual Pump Education: Milk Storage;Setup, frequency, and cleaning  Interventions    Discharge Pump: Manual;Referral sent for [facility] Pump  Consult Status Consult Status: Follow-up Date: 02/17/24 Follow-up type: In-patient    [facility] 02/16/2024, 10:30 AM

## 2024-02-16 NOTE — Lactation Note (Signed)
 This note was copied from a baby's chart. Lactation Consultation Note  Patient Name: Ana Horton Unijb'd Date: 02/16/2024 Age:22 hours Reason for consult: Initial assessment;Primapara;Term Mom holding baby STS. RN had told LC that mom was BF but when [facility] got there she was done.  Newborn feeding habits, behavior, STS, I&O, body alignment reviewed. Mom encouraged to feed baby 8-12 times/24 hours and with feeding cues.  Praised mom for holding baby STS.  Worked w/mom on hand expression. Mom was happy to see colostrum. Mom is very compressible. Answered the questions mom had. Encouraged mom to call for assistance or questions.   Maternal Data Has patient been taught Hand Expression?: Yes Does the patient have breastfeeding experience prior to this delivery?: No  Feeding    LATCH Score       Type of Nipple: Flat (very compressible)  Comfort (Breast/Nipple): Soft / non-tender         Lactation Tools Discussed/Used    Interventions    Discharge    Consult Status Consult Status: Follow-up Date: 02/16/24 Follow-up type: In-patient    Krystyna Cleckley G 02/16/2024, 3:31 AM

## 2024-02-16 NOTE — Discharge Summary (Signed)
 Postpartum Discharge Summary  Date of Service updated***     Patient Name: Ana Horton DOB: [birth year] MRN: [id number]  Date of admission: 02/15/2024 Delivery date:02/16/2024 Delivering provider: NEWTON MERING Date of discharge: 02/16/2024  Admitting diagnosis: Indication for care in labor or delivery [O75.9] Intrauterine pregnancy: [redacted]w[redacted]d     Secondary diagnosis:  Principal Problem:   Indication for care in labor or delivery Active Problems:   Vaginal delivery  Additional problems: ***    Discharge diagnosis: Term Pregnancy Delivered                                              Post partum procedures:{Postpartum procedures:23558} Augmentation: Pitocin  and Cytotec  Complications: None  Hospital course: Induction of Labor With Vaginal Delivery   23 y.o. yo G4P0030 at [redacted]w[redacted]d was admitted to the hospital 02/15/2024 for induction of labor.  Indication for induction: Elective.  Patient had an labor course complicated by occasional prolonged decels.   Membrane Rupture Time/Date: 11:01 AM,02/15/2024  Delivery Method:Vaginal, Spontaneous Operative Delivery:N/A Episiotomy: None Lacerations:  None Details of delivery can be found in separate delivery note.  Patient had a postpartum course complicated by***. Patient is discharged home 02/16/24.  Newborn Data: Birth date:02/16/2024 Birth time:12:33 AM Gender:Female Living status:Living Apgars:8 ,9  Weight:   Magnesium Sulfate received: No BMZ received: No Rhophylac:N/A MMR:N/A T-DaP:  Flu: No RSV Vaccine received: No Transfusion:{Transfusion received:30440034}  Immunizations received:  There is no immunization history on file for this patient.  Physical exam  Vitals:   02/15/24 2133 02/15/24 2201 02/16/24 0045 02/16/24 0102  BP: 112/76 122/87 (!) 111/98 (!) 130/113  Pulse: 90 (!) 294 92 (!) 106  Resp:  16 17   Temp:  98.5 F (36.9 C)    TempSrc:  Oral    SpO2:      Weight:      Height:        General: {Exam; general:21111117} Lochia: {Desc; appropriate/inappropriate:30686::appropriate} Uterine Fundus: {Desc; firm/soft:30687} Incision: {Exam; incision:21111123} DVT Evaluation: {Exam; dvt:2111122} Labs: Lab Results  Component Value Date   WBC 10.0 02/15/2024   HGB 10.6 (L) 02/15/2024   HCT 32.4 (L) 02/15/2024   MCV 88.0 02/15/2024   PLT 261 02/15/2024      Latest Ref Rng & Units 01/04/2023   11:54 PM  CMP  Glucose 70 - 99 mg/dL 87   BUN 6 - 20 mg/dL 12   Creatinine 9.55 - 1.00 mg/dL 9.09   Sodium 864 - 854 mmol/L 135   Potassium 3.5 - 5.1 mmol/L 3.5   Chloride 98 - 111 mmol/L 107   CO2 22 - 32 mmol/L 21   Calcium 8.9 - 10.3 mg/dL 8.8   Total Protein 6.5 - 8.1 g/dL 5.9   Total Bilirubin 0.3 - 1.2 mg/dL 0.5   Alkaline Phos 38 - 126 U/L 42   AST 15 - 41 U/L 14   ALT 0 - 44 U/L 11    Edinburgh Score:     No data to display         No data recorded  After visit meds:  Allergies as of 02/16/2024   No Known Allergies   Med Rec must be completed prior to using this [facility]***        Discharge home in stable condition Infant Feeding: Bottle Infant Disposition:{CHL IP OB HOME WITH FNUYZM:76418} Discharge instruction: per After  Visit Summary and Postpartum booklet. Activity: Advance as tolerated. Pelvic rest for 6 weeks.  Diet: routine diet Future Appointments: Future Appointments  Date Time Provider Department Center  02/17/2024  1:55 PM Vannie Cornell JONELLE EDDY [facility]  03/23/2024  3:35 PM Vannie Cornell JONELLE, CNM [facility]   Follow up Visit:  Follow-up Information     [facility] Follow up in 6 week(s).   Specialty: Obstetrics and Gynecology Contact information: [address] [place]  [address] [phone]                 Please schedule this patient for a In person postpartum visit in 4 weeks with the following provider: CNM - Appt already made with  Jamilla.  Additional Postpartum F/U:N/A  Low risk pregnancy complicated by: NA Delivery mode:  Vaginal, Spontaneous Anticipated Birth Control:  None   02/16/2024 Cathlean Ely, CNM

## 2024-02-17 ENCOUNTER — Encounter: Admitting: Certified Nurse Midwife

## 2024-02-17 ENCOUNTER — Other Ambulatory Visit

## 2024-02-17 MED ORDER — SENNA 8.6 MG PO TABS: 2.0000 | ORAL_TABLET | Freq: Every evening | ORAL | 0 refills | Status: AC | PRN

## 2024-02-17 MED ORDER — IBUPROFEN 800 MG PO TABS: 800.0000 mg | ORAL_TABLET | Freq: Three times a day (TID) | ORAL | 0 refills | Status: AC | PRN

## 2024-02-17 MED ORDER — ACETAMINOPHEN 500 MG PO TABS: 1000.0000 mg | ORAL_TABLET | Freq: Three times a day (TID) | ORAL | 0 refills | Status: AC | PRN

## 2024-02-17 NOTE — Lactation Note (Signed)
 This note was copied from a baby's chart. Lactation Consultation Note  Patient Name: Ana Horton Unijb'd Date: 02/17/2024 Age:22 hours Reason for consult: Follow-up assessment;Term;Maternal discharge  P1, 39 wks, @ 37 hrs of life. Discharge anticipated today. Encouraged mom to keep working on big mouth latch with baby and use EBM or coconut oil after each feed. Discussed cluster feeding overnight/ early morning brings in our milk supply, shared expectations of milk coming in. Highlighted risk of engorgement. Discussed hand pump/express to soften breasts, motrin  as anti-inflammatory, and ice packs for 10-20 minutes post feed/pumping if still over-full is the best treatments for inflamed/engorged breasts.  Maternal Data Does the patient have breastfeeding experience prior to this delivery?: No  Feeding Mother's Current Feeding Choice: Breast Milk and Formula Nipple Type: Slow - flow   Interventions Interventions: Breast feeding basics reviewed;Hand express;Breast compression;Expressed milk;Coconut oil;Hand pump;Education;LC Services brochure;CDC milk storage guidelines  Discharge Discharge Education: Engorgement and breast care Pump: Received Stork Pump;Manual;Personal  Consult Status Consult Status: Complete Date: 02/17/24 Follow-up type: In-patient    Calix Heinbaugh 02/17/2024, 1:59 PM

## 2024-02-17 NOTE — Patient Instructions (Signed)
 If interested in an outpatient lactation consult in office or virtually please reach out to us  at [facility] (First Floor) [address], [place]  Please call [phone] and press 4 for lactation.    Lactation support groups:  [facility], Tuesdays 10:00 am -12:00 pm at [address] on the second floor in the conference room, lactating parents and lap babies welcome.  [email or web address]  [email or web address]   Geraldina Louder, [facility]

## 2024-02-17 NOTE — Clinical Social Work Maternal (Signed)
 CLINICAL SOCIAL WORK MATERNAL/CHILD NOTE  Patient Details  Name: Ana Horton MRN: [id number] Date of Birth: April 01, 2001  Date:  02/17/2024  Clinical Social Worker Initiating Note:  Rosina Molt Date/Time: Initiated:  02/17/24/1318     Child's Name:  Ana Horton   Biological Parents:  Mother, Father Ana Horton 2002/[birth year]/13 Ana Horton 01[birth year]2001)   Need for Interpreter:  None   Reason for Referral:  Current Substance Use/Substance Use During Pregnancy  , Behavioral Health Concerns   Address:  [address] [place] [facility] [address]    Phone number:  [phone] (home)     Additional phone number:   Household Members/Support Persons (HM/SP):   Household Member/Support Person 1   HM/SP Name Relationship DOB or Age  HM/SP -1 Ana Horton FOB [birth year]2001  HM/SP -2        HM/SP -3        HM/SP -4        HM/SP -5        HM/SP -6        HM/SP -7        HM/SP -8          Natural Supports (not living in the home):  Spouse/significant other, Immediate Family   Professional Supports: None   Employment: Full-time   Type of Work: Quarry manager- Metallurgist   Education:  [employer] school graduate   Homebound arranged:    Surveyor, quantity Resources:  [employer]   Other Resources:  Sales executive  , WIC (MOB has applied for [employer] and CSW will complete a [employer] application)   Cultural/Religious Considerations Which May Impact Care:    Strengths:  Ability to meet basic needs  , Understanding of illness, Home prepared for child  , Pediatrician chosen   Psychotropic Medications:         Pediatrician:    Armed forces operational officer area  Pediatrician List:   [facility]  [place]    [place]      Pediatrician Fax Number:    Risk Factors/Current Problems:  Substance Use  , Mental Health Concerns     Cognitive State:  Able to Concentrate  , Alert  , Linear Thinking  ,  Insightful  , Goal Oriented     Mood/Affect:  Calm  , Comfortable  , Interested  , Relaxed     CSW Assessment: CSW received a consult for THC use, anxiety and depression. CSW met MOB at bedside to complete a full psychosocial assessment. CSW entered the room, introduced herself and acknowledged that her family was present; MOB was agreeable and her family stepped out. CSW explained her role and the reason for the visit. MOB was polite, easy to engage, receptive to meeting with CSW, and appeared forthcoming.  CSW collected MOB's demographic information and inquired about mental health history. MOB denied any/all mental health history. CSW provided education regarding the baby blues period vs. perinatal mood disorders, discussed treatment and gave resources for mental health follow up if concerns arise.  CSW recommends self-evaluation during the postpartum time period using the New Mom Checklist from Postpartum Progress and encouraged MOB to contact a medical professional if symptoms are noted at any time. CSW assessed for safety with MOB SI/HI/DV;MOB denied all.  CSW asked MOB does she receive support resources; MOB said NO(WIC and food stamps). MOB reported she reapplied for the foodstamps due to the arrival of the  infant and plans to apply for [facility] upon discharge. CSW asked MOB would a referral for [facility] be supportive; MOB said yes. CSW will complete a referral for [facility] and provide followup documentation for support. MOB reported having all essential items for the infant including a carseat, bassinet and crib for safe sleeping. CSW provided review of Sudden Infant Death Syndrome (SIDS) precautions.   CSW informed MOB due to [facility] during her pregnancy; the hospital will perform a UDS and CDS on the infant. If the screenings return with positive results a report to CPS will be made; MOB was understanding. MOB reported her last use being 4 weeks ago; and she had been using THC since the age 61 years old.  MOB  reported using THC due to lack of sleeping and suppressed appetite. MOB denied the use of illegal substances.  The Infant's UDS was negative for all substances. CSW will continue follow the CDS and complete a CPS report if necessary.  CSW Plan/Description:    No Further Intervention Required/No Barriers to Discharge, Sudden Infant Death Syndrome (SIDS) Education, Perinatal Mood and Anxiety Disorder (PMADs) Education, Hospital Drug Screen Policy Information, Other Information/Referral to [employer], CSW Will Continue to Monitor Umbilical Cord Tissue Drug Screen Results and Make Report if Ranelle Rosina MARLA Joshua, LCSW 02/17/2024, 1:22 PM

## 2024-02-25 ENCOUNTER — Inpatient Hospital Stay (HOSPITAL_COMMUNITY)

## 2024-02-25 ENCOUNTER — Telehealth (HOSPITAL_COMMUNITY): Payer: Self-pay

## 2024-02-25 NOTE — Telephone Encounter (Signed)
 02/25/2024 1825  Name: Gerica Koble MRN: [id number] DOB: Nov 05, 2000  Reason for Call:  Transition of Care Hospital Discharge Call  Contact Status: Patient Contact Status: Complete  Language assistant needed:          Follow-Up Questions: Do You Have Any Concerns About Your Health As You Heal From Delivery?: No Do You Have Any Concerns About Your Infants Health?: Yes What Concerns Do You Have About Your Baby?: Patient states that baby's cord has fallen off. RN reviewed cord care and bathing. Patient has no other concerns or questions.  Edinburgh Postnatal Depression Scale:  In the Past 7 Days: I have been able to laugh and see the funny side of things.: As much as I always could I have looked forward with enjoyment to things.: As much as I ever did I have blamed myself unnecessarily when things went wrong.: No, never I have been anxious or worried for no good reason.: Hardly ever I have felt scared or panicky for no good reason.: No, not at all Things have been getting on top of me.: No, I have been coping as well as ever I have been so unhappy that I have had difficulty sleeping.: Not at all I have felt sad or miserable.: No, not at all I have been so unhappy that I have been crying.: No, never The thought of harming myself has occurred to me.: Never Edinburgh Postnatal Depression Scale Total: 1  PHQ2-9 Depression Scale:     Discharge Follow-up: Edinburgh score requires follow up?: No Patient was advised of the following resources:: Breastfeeding Support Group, Support Group  Post-discharge interventions: Reviewed Newborn Safe Sleep Practices  Signature  Rosaline Deretha PEAK

## 2024-03-23 ENCOUNTER — Ambulatory Visit: Payer: Self-pay | Admitting: Certified Nurse Midwife

## 2024-03-30 ENCOUNTER — Ambulatory Visit: Admitting: Certified Nurse Midwife

## 2024-04-20 ENCOUNTER — Ambulatory Visit: Admitting: Certified Nurse Midwife
